# Patient Record
Sex: Male | Born: 1985 | Race: Black or African American | Hispanic: No | Marital: Single | Smoking: Current every day smoker
Health system: Southern US, Community
[De-identification: ages and names within clinical notes are randomized; demographics above are authoritative.]

## PROBLEM LIST (undated history)

## (undated) DIAGNOSIS — B977 Papillomavirus as the cause of diseases classified elsewhere: Secondary | ICD-10-CM

## (undated) DIAGNOSIS — I10 Essential (primary) hypertension: Secondary | ICD-10-CM

## (undated) DIAGNOSIS — E119 Type 2 diabetes mellitus without complications: Secondary | ICD-10-CM

## (undated) DIAGNOSIS — E785 Hyperlipidemia, unspecified: Secondary | ICD-10-CM

## (undated) HISTORY — PX: ABCESS DRAINAGE: SHX399

---
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
## 2009-05-05 ENCOUNTER — Emergency Department (HOSPITAL_COMMUNITY): Admission: EM | Admit: 2009-05-05 | Discharge: 2009-05-05 | Payer: Self-pay | Admitting: Family Medicine

## 2010-10-22 ENCOUNTER — Emergency Department (HOSPITAL_COMMUNITY)
Admission: EM | Admit: 2010-10-22 | Discharge: 2010-10-22 | Payer: Self-pay | Source: Home / Self Care | Admitting: Family Medicine

## 2010-10-23 ENCOUNTER — Inpatient Hospital Stay (HOSPITAL_COMMUNITY): Admission: EM | Admit: 2010-10-23 | Discharge: 2010-10-28 | Payer: Self-pay | Admitting: Emergency Medicine

## 2011-01-11 ENCOUNTER — Emergency Department (HOSPITAL_COMMUNITY)
Admission: EM | Admit: 2011-01-11 | Discharge: 2011-01-11 | Disposition: A | Discharge status: Home / Self Care | Payer: Self-pay | Attending: Emergency Medicine | Admitting: Emergency Medicine

## 2011-01-11 DIAGNOSIS — J029 Acute pharyngitis, unspecified: Secondary | ICD-10-CM | POA: Insufficient documentation

## 2011-01-11 DIAGNOSIS — R599 Enlarged lymph nodes, unspecified: Secondary | ICD-10-CM | POA: Insufficient documentation

## 2011-01-12 LAB — STREP A DNA PROBE: Group A Strep Probe: NEGATIVE

## 2011-02-21 LAB — BASIC METABOLIC PANEL
BUN: 3 mg/dL — ABNORMAL LOW (ref 6–23)
BUN: 6 mg/dL (ref 6–23)
CO2: 28 mEq/L (ref 19–32)
CO2: 29 mEq/L (ref 19–32)
CO2: 30 mEq/L (ref 19–32)
Calcium: 8.2 mg/dL — ABNORMAL LOW (ref 8.4–10.5)
Calcium: 8.2 mg/dL — ABNORMAL LOW (ref 8.4–10.5)
Calcium: 8.4 mg/dL (ref 8.4–10.5)
Calcium: 8.5 mg/dL (ref 8.4–10.5)
Calcium: 8.6 mg/dL (ref 8.4–10.5)
Chloride: 106 mEq/L (ref 96–112)
Chloride: 96 mEq/L (ref 96–112)
Creatinine, Ser: 0.97 mg/dL (ref 0.4–1.5)
Creatinine, Ser: 1.03 mg/dL (ref 0.4–1.5)
Creatinine, Ser: 1.1 mg/dL (ref 0.4–1.5)
Creatinine, Ser: 1.29 mg/dL (ref 0.4–1.5)
GFR calc Af Amer: >60 mL/min (ref >60)
GFR calc Af Amer: >60 mL/min (ref >60)
GFR calc Af Amer: >60 mL/min (ref >60)
GFR calc Af Amer: >60 mL/min (ref >60)
GFR calc non Af Amer: >60 mL/min (ref >60)
GFR calc non Af Amer: >60 mL/min (ref >60)
Glucose, Bld: 103 mg/dL — ABNORMAL HIGH (ref 70–99)
Glucose, Bld: 122 mg/dL — ABNORMAL HIGH (ref 70–99)
Glucose, Bld: 92 mg/dL (ref 70–99)
Potassium: 3.2 mEq/L — ABNORMAL LOW (ref 3.5–5.1)
Potassium: 3.5 mEq/L (ref 3.5–5.1)
Sodium: 132 mEq/L — ABNORMAL LOW (ref 135–145)
Sodium: 135 mEq/L (ref 135–145)
Sodium: 137 mEq/L (ref 135–145)
Sodium: 141 mEq/L (ref 135–145)

## 2011-02-21 LAB — CBC
HCT: 36.2 % — ABNORMAL LOW (ref 39.0–52.0)
HCT: 36.6 % — ABNORMAL LOW (ref 39.0–52.0)
HCT: 38.4 % — ABNORMAL LOW (ref 39.0–52.0)
HCT: 41.5 % (ref 39.0–52.0)
Hemoglobin: 11.7 g/dL — ABNORMAL LOW (ref 13.0–17.0)
Hemoglobin: 12.1 g/dL — ABNORMAL LOW (ref 13.0–17.0)
Hemoglobin: 12.2 g/dL — ABNORMAL LOW (ref 13.0–17.0)
Hemoglobin: 12.5 g/dL — ABNORMAL LOW (ref 13.0–17.0)
Hemoglobin: 14.3 g/dL (ref 13.0–17.0)
MCH: 28.9 pg (ref 26.0–34.0)
MCH: 29.1 pg (ref 26.0–34.0)
MCH: 29.2 pg (ref 26.0–34.0)
MCH: 30.4 pg (ref 26.0–34.0)
MCHC: 33.1 g/dL (ref 30.0–36.0)
MCHC: 34.1 g/dL (ref 30.0–36.0)
MCHC: 34.5 g/dL (ref 30.0–36.0)
MCV: 88.1 fL (ref 78.0–100.0)
MCV: 88.7 fL (ref 78.0–100.0)
Platelets: 157 10*3/uL (ref 150–400)
Platelets: 165 10*3/uL (ref 150–400)
Platelets: 187 10*3/uL (ref 150–400)
Platelets: 219 10*3/uL (ref 150–400)
RBC: 4.04 MIL/uL — ABNORMAL LOW (ref 4.22–5.81)
RBC: 4.05 MIL/uL — ABNORMAL LOW (ref 4.22–5.81)
RBC: 4.16 MIL/uL — ABNORMAL LOW (ref 4.22–5.81)
RBC: 4.71 MIL/uL (ref 4.22–5.81)
RDW: 13.4 % (ref 11.5–15.5)
RDW: 13.7 % (ref 11.5–15.5)
RDW: 13.9 % (ref 11.5–15.5)
RDW: 14 % (ref 11.5–15.5)
WBC: 17.5 10*3/uL — ABNORMAL HIGH (ref 4.0–10.5)
WBC: 22.2 10*3/uL — ABNORMAL HIGH (ref 4.0–10.5)
WBC: 25.3 10*3/uL — ABNORMAL HIGH (ref 4.0–10.5)
WBC: 26.2 10*3/uL — ABNORMAL HIGH (ref 4.0–10.5)
WBC: 8.3 10*3/uL (ref 4.0–10.5)

## 2011-02-21 LAB — COMPREHENSIVE METABOLIC PANEL
ALT: 16 U/L (ref 0–53)
Alkaline Phosphatase: 75 U/L (ref 39–117)
CO2: 27 mEq/L (ref 19–32)
GFR calc non Af Amer: >60 mL/min (ref >60)
Glucose, Bld: 107 mg/dL — ABNORMAL HIGH (ref 70–99)
Potassium: 3.8 mEq/L (ref 3.5–5.1)
Sodium: 137 mEq/L (ref 135–145)
Total Protein: 6.7 g/dL (ref 6.0–8.3)

## 2011-02-21 LAB — DIFFERENTIAL
Basophils Absolute: 0 10*3/uL (ref 0.0–0.1)
Basophils Absolute: 0 10*3/uL (ref 0.0–0.1)
Basophils Absolute: 0 10*3/uL (ref 0.0–0.1)
Basophils Relative: 0 % (ref 0–1)
Basophils Relative: 0 % (ref 0–1)
Basophils Relative: 0 % (ref 0–1)
Basophils Relative: 0 % (ref 0–1)
Eosinophils Absolute: 0 10*3/uL (ref 0.0–0.7)
Eosinophils Absolute: 0 10*3/uL (ref 0.0–0.7)
Eosinophils Absolute: 0 10*3/uL (ref 0.0–0.7)
Eosinophils Absolute: 0.2 10*3/uL (ref 0.0–0.7)
Eosinophils Relative: 0 % (ref 0–5)
Eosinophils Relative: 0 % (ref 0–5)
Eosinophils Relative: 2 % (ref 0–5)
Lymphocytes Relative: 10 % — ABNORMAL LOW (ref 12–46)
Lymphocytes Relative: 14 % (ref 12–46)
Lymphocytes Relative: 4 % — ABNORMAL LOW (ref 12–46)
Lymphocytes Relative: 7 % — ABNORMAL LOW (ref 12–46)
Lymphs Abs: 1.2 10*3/uL (ref 0.7–4.0)
Lymphs Abs: 2.2 10*3/uL (ref 0.7–4.0)
Monocytes Absolute: 2.4 10*3/uL — ABNORMAL HIGH (ref 0.1–1.0)
Monocytes Absolute: 3.3 10*3/uL — ABNORMAL HIGH (ref 0.1–1.0)
Monocytes Relative: 10 % (ref 3–12)
Monocytes Relative: 12 % (ref 3–12)
Monocytes Relative: 9 % (ref 3–12)
Neutro Abs: 22.2 10*3/uL — ABNORMAL HIGH (ref 1.7–7.7)
Neutrophils Relative %: 74 % (ref 43–77)
Neutrophils Relative %: 81 % — ABNORMAL HIGH (ref 43–77)
Neutrophils Relative %: 83 % — ABNORMAL HIGH (ref 43–77)
Neutrophils Relative %: 85 % — ABNORMAL HIGH (ref 43–77)
Neutrophils Relative %: 86 % — ABNORMAL HIGH (ref 43–77)

## 2011-02-21 LAB — ANAEROBIC CULTURE

## 2011-02-21 LAB — WOUND CULTURE: Gram Stain: NONE SEEN

## 2011-02-21 LAB — PATHOLOGIST SMEAR REVIEW

## 2011-02-21 LAB — CULTURE, BLOOD (ROUTINE X 2): Culture  Setup Time: 201111131406

## 2011-02-21 LAB — CULTURE, ROUTINE-ABSCESS

## 2011-02-21 LAB — VANCOMYCIN, TROUGH: Vancomycin Tr: 11.4 ug/mL (ref 10.0–20.0)

## 2012-04-06 ENCOUNTER — Encounter (HOSPITAL_COMMUNITY): Payer: Self-pay | Admitting: Emergency Medicine

## 2012-04-06 ENCOUNTER — Emergency Department (HOSPITAL_COMMUNITY): Payer: Self-pay

## 2012-04-06 ENCOUNTER — Emergency Department (HOSPITAL_COMMUNITY)
Admission: EM | Admit: 2012-04-06 | Discharge: 2012-04-06 | Disposition: A | Discharge status: Home / Self Care | Payer: Self-pay | Attending: Emergency Medicine | Admitting: Emergency Medicine

## 2012-04-06 DIAGNOSIS — W19XXXA Unspecified fall, initial encounter: Secondary | ICD-10-CM

## 2012-04-06 DIAGNOSIS — M79671 Pain in right foot: Secondary | ICD-10-CM

## 2012-04-06 DIAGNOSIS — M25579 Pain in unspecified ankle and joints of unspecified foot: Secondary | ICD-10-CM | POA: Insufficient documentation

## 2012-04-06 DIAGNOSIS — W108XXA Fall (on) (from) other stairs and steps, initial encounter: Secondary | ICD-10-CM | POA: Insufficient documentation

## 2012-04-06 MED ORDER — IBUPROFEN 800 MG PO TABS: 800.0000 mg | ORAL_TABLET | Freq: Three times a day (TID) | ORAL | Status: AC | PRN

## 2012-04-06 NOTE — ED Notes (Signed)
Ortho paged for crutches and aso

## 2012-04-06 NOTE — Discharge Instructions (Signed)
X-ray was normal. Ankle brace and crutches. Ice. Elevate. Pain medication.

## 2012-04-06 NOTE — ED Provider Notes (Signed)
History     CSN: [id number]  Arrival date & time 04/06/12  1233   First MD Initiated Contact with Patient 04/06/12 1300      Chief Complaint  Patient presents with  . Fall  . Foot Pain    right   . Ankle Pain    right    (Consider location/radiation/quality/duration/timing/severity/associated sxs/prior treatment) HPI... accidental fall down a couple steps last night. Complains of right foot pain. No head or neck trauma. Pain is minimal palpation makes it worse  History reviewed. No pertinent past medical history.  Past Surgical History  Procedure Date  . Abcess drainage     History reviewed. No pertinent family history.  History  Substance Use Topics  . Smoking status: Current Everyday Smoker  . Smokeless tobacco: Not on file  . Alcohol Use: No      Review of Systems  All other systems reviewed and are negative.    Allergies  Review of patient's allergies indicates no known allergies.  Home Medications   Current Outpatient Rx  Name Route Sig Dispense Refill  . IBUPROFEN 200 MG PO TABS Oral Take 800 mg by mouth every 6 (six) hours as needed. For pain.    . IBUPROFEN 800 MG PO TABS Oral Take 1 tablet (800 mg total) by mouth every 8 (eight) hours as needed for pain. 20 tablet 0    BP 126/68  Pulse 56  Temp(Src) 98.1 F (36.7 C) (Oral)  Resp 15  SpO2 97%  Physical Exam  Nursing note and vitals reviewed. Constitutional: He is oriented to person, place, and time. He appears well-developed and well-nourished.  HENT:  Head: Normocephalic and atraumatic.  Eyes: Conjunctivae and EOM are normal. Pupils are equal, round, and reactive to light.  Neck: Normal range of motion. Neck supple.  Musculoskeletal:       Right lower extremity:  Ankle nontender. Tenderness around the lateral aspect of foot.  Neurovascular intact  Neurological: He is alert and oriented to person, place, and time.  Skin: Skin is warm and dry.  Psychiatric: He has a normal mood and  affect.    ED Course  Procedures (including critical care time)  Labs Reviewed - No data to display Dg Foot Complete Right  04/06/2012  *RADIOLOGY REPORT*  Clinical Data: Fall with right foot pain.  RIGHT FOOT COMPLETE - 3+ VIEW  Comparison: None.  Findings: No evidence of acute fracture or dislocation.  There is a hallux valgus deformity.  No bony lesions or soft tissue abnormalities.  IMPRESSION: No acute fracture identified.  Hallux valgus deformity.  Original Report Authenticated By: Reola Calkins, M.D.     1. Fall   2. Right foot pain       MDM  X-ray of right foot negative. Ankle brace, crutches, ibuprofen        Donnetta Hutching, MD 04/06/12 1512

## 2012-04-06 NOTE — Progress Notes (Signed)
Orthopedic Tech Progress Note Patient Details:  Michael Blankenship 10-26-1986 [id number]  Other Ortho Devices Type of Ortho Device: Crutches;ASO Ortho Device Location: right foot Ortho Device Interventions: Application   [name] T 04/06/2012, 3:01 PM

## 2012-04-06 NOTE — ED Notes (Signed)
Pt reports fell last night down steps last night. Pt c/o right foot/ankle pain.

## 2012-12-16 ENCOUNTER — Emergency Department (HOSPITAL_COMMUNITY)
Admission: EM | Admit: 2012-12-16 | Discharge: 2012-12-16 | Disposition: A | Discharge status: Home / Self Care | Payer: Self-pay | Attending: Emergency Medicine | Admitting: Emergency Medicine

## 2012-12-16 ENCOUNTER — Encounter (HOSPITAL_COMMUNITY): Payer: Self-pay | Admitting: Cardiology

## 2012-12-16 DIAGNOSIS — F17200 Nicotine dependence, unspecified, uncomplicated: Secondary | ICD-10-CM | POA: Insufficient documentation

## 2012-12-16 DIAGNOSIS — J020 Streptococcal pharyngitis: Secondary | ICD-10-CM | POA: Insufficient documentation

## 2012-12-16 LAB — RAPID STREP SCREEN (MED CTR MEBANE ONLY): Streptococcus, Group A Screen (Direct): POSITIVE — AB

## 2012-12-16 MED ORDER — PENICILLIN V POTASSIUM 500 MG PO TABS: 500.0000 mg | ORAL_TABLET | Freq: Four times a day (QID) | ORAL | Status: AC

## 2012-12-16 MED ORDER — IBUPROFEN 600 MG PO TABS: 600.0000 mg | ORAL_TABLET | Freq: Four times a day (QID) | ORAL | Status: DC | PRN

## 2012-12-16 NOTE — ED Notes (Signed)
MD at bedside with RN. 

## 2012-12-16 NOTE — ED Notes (Signed)
Pt c/o sore throat since Thursday with mild fever. Reports taking OTC medications without any relief.

## 2012-12-16 NOTE — ED Notes (Signed)
MD informed pt treatment is either antibiotic pills or an antibiotic shot; pt states he does not want a shot but cannot afford the antibiotic pills;

## 2012-12-16 NOTE — ED Provider Notes (Signed)
History   This chart was scribed for Michael Kaplan, MD by Gerlean Ren, ED Scribe. This patient was seen in room TR08C/TR08C and the patient's care was started at 7:37 PM    CSN: [id number]  Arrival date & time 12/16/12  1641   First MD Initiated Contact with Patient 12/16/12 1931      Chief Complaint  Patient presents with  . Sore Throat     The history is provided by the patient. No language interpreter was used.   Cohl Behrens is a 27 y.o. male who presents to the Emergency Department complaining of 4-5 days of a sore throat with associated chills, fever, myalgias, and painful swallowing causing decreased appetite.  Pt has h/o tonsillar abscess.  Pt denies cough.  Pt is a current everyday smoker but denies alcohol use.   History reviewed. No pertinent past medical history.  Past Surgical History  Procedure Date  . Abcess drainage     History reviewed. No pertinent family history.  History  Substance Use Topics  . Smoking status: Current Every Day Smoker  . Smokeless tobacco: Not on file  . Alcohol Use: No      Review of Systems  Constitutional: Positive for fever and chills.  HENT: Positive for sore throat.   Respiratory: Negative for cough.   Musculoskeletal: Positive for myalgias.    Allergies  Review of patient's allergies indicates no known allergies.  Home Medications   Current Outpatient Rx  Name  Route  Sig  Dispense  Refill  . DIPHENHYDRAMINE HCL 25 MG PO CAPS   Oral   Take 25 mg by mouth every 6 (six) hours as needed. For swelling/pain         . IBUPROFEN 200 MG PO TABS   Oral   Take 800 mg by mouth every 6 (six) hours as needed. For pain           BP 144/81  Pulse 68  Temp 99 F (37.2 C) (Oral)  Resp 18  SpO2 99%  Physical Exam  Nursing note and vitals reviewed. Constitutional: He is oriented to person, place, and time. He appears well-developed and well-nourished. No distress.  HENT:  Head: Normocephalic and atraumatic.   Erythema of posterior pharynx, no exudates  Eyes: Conjunctivae normal are normal.  Neck: Neck supple. No tracheal deviation present.       Anterior cervical lymphadenopathy.  Cardiovascular: Normal rate, regular rhythm and normal heart sounds.   No murmur heard. Pulmonary/Chest: Effort normal and breath sounds normal. No respiratory distress. He has no wheezes.       Anterior auscultation of lungs clear.  Abdominal: Soft. Bowel sounds are normal.  Musculoskeletal: Normal range of motion.  Neurological: He is alert and oriented to person, place, and time.  Skin: Skin is warm and dry.  Psychiatric: He has a normal mood and affect. His behavior is normal.    ED Course  Procedures (including critical care time) DIAGNOSTIC STUDIES: Oxygen Saturation is 99% on room air, normal by my interpretation.    COORDINATION OF CARE: 7:42 PM- Patient informed of clinical course, understands medical decision-making process, and agrees with plan.  Pt refuses to have IM treatment in buttock.  Results for orders placed during the hospital encounter of 12/16/12  RAPID STREP SCREEN      Component Value Range   Streptococcus, Group A Screen (Direct) POSITIVE (*) NEGATIVE   No diagnosis found.    MDM  I personally performed the services described in this  documentation, which was scribed in my presence. The recorded information has been reviewed and is accurate.  Pt comes in with cc of sore throat. Has 3/4 Centor's criteria and + strep throat. Will give Pen V K. Pt is refusing IM Penicillin.        Michael Kaplan, MD 12/16/12 2017

## 2014-03-07 ENCOUNTER — Emergency Department (HOSPITAL_COMMUNITY)
Admission: EM | Admit: 2014-03-07 | Discharge: 2014-03-07 | Disposition: A | Discharge status: Home / Self Care | Payer: Self-pay | Attending: Emergency Medicine | Admitting: Emergency Medicine

## 2014-03-07 ENCOUNTER — Encounter (HOSPITAL_COMMUNITY): Payer: Self-pay | Admitting: Emergency Medicine

## 2014-03-07 DIAGNOSIS — R590 Localized enlarged lymph nodes: Secondary | ICD-10-CM

## 2014-03-07 DIAGNOSIS — F17200 Nicotine dependence, unspecified, uncomplicated: Secondary | ICD-10-CM | POA: Insufficient documentation

## 2014-03-07 DIAGNOSIS — J029 Acute pharyngitis, unspecified: Secondary | ICD-10-CM | POA: Insufficient documentation

## 2014-03-07 LAB — RAPID STREP SCREEN (MED CTR MEBANE ONLY): STREPTOCOCCUS, GROUP A SCREEN (DIRECT): NEGATIVE

## 2014-03-07 MED ORDER — CHLORHEXIDINE GLUCONATE 0.12 % MT SOLN: 15.0000 mL | Freq: Two times a day (BID) | OROMUCOSAL | Status: DC

## 2014-03-07 NOTE — ED Notes (Signed)
He states hes had a swollen knot under his chin for a year then noticed today pain and white spots to back of throat.

## 2014-03-07 NOTE — Discharge Instructions (Signed)
Pharyngitis Pharyngitis is redness, pain, and swelling (inflammation) of your pharynx.  CAUSES  Pharyngitis is usually caused by infection. Most of the time, these infections are from viruses (viral) and are part of a cold. However, sometimes pharyngitis is caused by bacteria (bacterial). Pharyngitis can also be caused by allergies. Viral pharyngitis may be spread from person to person by coughing, sneezing, and personal items or utensils (cups, forks, spoons, toothbrushes). Bacterial pharyngitis may be spread from person to person by more intimate contact, such as kissing.  SIGNS AND SYMPTOMS  Symptoms of pharyngitis include:   Sore throat.   Tiredness (fatigue).   Low-grade fever.   Headache.  Joint pain and muscle aches.  Skin rashes.  Swollen lymph nodes.  Plaque-like film on throat or tonsils (often seen with bacterial pharyngitis). DIAGNOSIS  Your health care provider will ask you questions about your illness and your symptoms. Your medical history, along with a physical exam, is often all that is needed to diagnose pharyngitis. Sometimes, a rapid strep test is done. Other lab tests may also be done, depending on the suspected cause.  TREATMENT  Viral pharyngitis will usually get better in 3 4 days without the use of medicine. Bacterial pharyngitis is treated with medicines that kill germs (antibiotics).  HOME CARE INSTRUCTIONS   Drink enough water and fluids to keep your urine clear or pale yellow.   Only take over-the-counter or prescription medicines as directed by your health care provider:   If you are prescribed antibiotics, make sure you finish them even if you start to feel better.   Do not take aspirin.   Get lots of rest.   Gargle with 8 oz of salt water ( tsp of salt per 1 qt of water) as often as every 1 2 hours to soothe your throat.   Throat lozenges (if you are not at risk for choking) or sprays may be used to soothe your throat. SEEK MEDICAL  CARE IF:   You have large, tender lumps in your neck.  You have a rash.  You cough up green, yellow-brown, or bloody spit. SEEK IMMEDIATE MEDICAL CARE IF:   Your neck becomes stiff.  You drool or are unable to swallow liquids.  You vomit or are unable to keep medicines or liquids down.  You have severe pain that does not go away with the use of recommended medicines.  You have trouble breathing (not caused by a stuffy nose). MAKE SURE YOU:   Understand these instructions.  Will watch your condition.  Will get help right away if you are not doing well or get worse. Document Released: 11/27/2005 Document Revised: 09/17/2013 Document Reviewed: 08/04/2013 [facility] Patient Information 2014 [place], [place].   Lymphadenopathy Lymphadenopathy means "disease of the lymph glands." But the term is usually used to describe swollen or enlarged lymph glands, also called lymph nodes. These are the bean-shaped organs found in many locations including the neck, underarm, and groin. Lymph glands are part of the immune system, which fights infections in your body. Lymphadenopathy can occur in just one area of the body, such as the neck, or it can be generalized, with lymph node enlargement in several areas. The nodes found in the neck are the most common sites of lymphadenopathy. CAUSES  When your immune system responds to germs (such as viruses or bacteria ), infection-fighting cells and fluid build up. This causes the glands to grow in size. This is usually not something to worry about. Sometimes, the glands themselves can  become infected and inflamed. This is called lymphadenitis. Enlarged lymph nodes can be caused by many diseases:  Bacterial disease, such as strep throat or a skin infection.  Viral disease, such as a common cold.  Other germs, such as lyme disease, tuberculosis, or sexually transmitted diseases.  Cancers, such as lymphoma (cancer of the lymphatic system) or leukemia  (cancer of the white blood cells).  Inflammatory diseases such as lupus or rheumatoid arthritis.  Reactions to medications. Many of the diseases above are rare, but important. This is why you should see your caregiver if you have lymphadenopathy. SYMPTOMS   Swollen, enlarged lumps in the neck, back of the head or other locations.  Tenderness.  Warmth or redness of the skin over the lymph nodes.  Fever. DIAGNOSIS  Enlarged lymph nodes are often near the source of infection. They can help healthcare providers diagnose your illness. For instance:   Swollen lymph nodes around the jaw might be caused by an infection in the mouth.  Enlarged glands in the neck often signal a throat infection.  Lymph nodes that are swollen in more than one area often indicate an illness caused by a virus. Your caregiver most likely will know what is causing your lymphadenopathy after listening to your history and examining you. Blood tests, x-rays or other tests may be needed. If the cause of the enlarged lymph node cannot be found, and it does not go away by itself, then a biopsy may be needed. Your caregiver will discuss this with you. TREATMENT  Treatment for your enlarged lymph nodes will depend on the cause. Many times the nodes will shrink to normal size by themselves, with no treatment. Antibiotics or other medicines may be needed for infection. Only take over-the-counter or prescription medicines for pain, discomfort or fever as directed by your caregiver. HOME CARE INSTRUCTIONS  Swollen lymph glands usually return to normal when the underlying medical condition goes away. If they persist, contact your health-care provider. He/she might prescribe antibiotics or other treatments, depending on the diagnosis. Take any medications exactly as prescribed. Keep any follow-up appointments made to check on the condition of your enlarged nodes.  SEEK MEDICAL CARE IF:   Swelling lasts for more than two  weeks.  You have symptoms such as weight loss, night sweats, fatigue or fever that does not go away.  The lymph nodes are hard, seem fixed to the skin or are growing rapidly.  Skin over the lymph nodes is red and inflamed. This could mean there is an infection. SEEK IMMEDIATE MEDICAL CARE IF:   Fluid starts leaking from the area of the enlarged lymph node.  You develop a fever of 102 F (38.9 C) or greater.  Severe pain develops (not necessarily at the site of a large lymph node).  You develop chest pain or shortness of breath.  You develop worsening abdominal pain. MAKE SURE YOU:   Understand these instructions.  Will watch your condition.  Will get help right away if you are not doing well or get worse. Document Released: 09/05/2008 Document Revised: 02/19/2012 Document Reviewed: 09/05/2008 [facility] Patient Information 2014 [place], [place].   Emergency Department Resource Guide 1) Find a Doctor and Pay Out of Pocket Although you won't have to find out who is covered by your insurance plan, it is a good idea to ask around and get recommendations. You will then need to call the office and see if the doctor you have chosen will accept you as a new patient and  what types of options they offer for patients who are self-pay. Some doctors offer discounts or will set up payment plans for their patients who do not have insurance, but you will need to ask so you aren't surprised when you get to your appointment.  2) Contact Your Local Health Department Not all health departments have doctors that can see patients for sick visits, but many do, so it is worth a call to see if yours does. If you don't know where your local health department is, you can check in your phone book. The CDC also has a tool to help you locate your state's health department, and many state websites also have listings of all of their local health departments.  3) Find a Walk-in Clinic If your illness is not likely  to be very severe or complicated, you may want to try a walk in clinic. These are popping up all over the country in pharmacies, drugstores, and shopping centers. They're usually staffed by nurse practitioners or physician assistants that have been trained to treat common illnesses and complaints. They're usually fairly quick and inexpensive. However, if you have serious medical issues or chronic medical problems, these are probably not your best option.  No Primary Care Doctor: - Call Health Connect at  [phone] - they can help you locate a primary care doctor that  accepts your insurance, provides certain services, etc. - Physician Referral Service- [phone]  Chronic Pain Problems: Organization         Address  Phone   Notes  [facility]  [phone] Patients need to be referred by their primary care doctor.   Medication Assistance: Organization         Address  Phone   Notes  [facility] Medication [facility] [address] [place] [place], [place] [address] [phone] --Must be a resident of [facility] -- Must have NO insurance coverage whatsoever (no Medicaid/ Medicare, etc.) -- The pt. MUST have a primary care doctor that directs their care regularly and follows them in the community   MedAssist  [phone]   [employer]  [phone]    Agencies that provide inexpensive medical care: Organization         Address  Phone   Notes  [facility]  [phone]   [facility]    [phone]   [facility] [address] [place], [place] [address] [phone]   [facility] [address] [place] [place] [phone]   Planned Parenthood    [phone]   [facility]    [phone]   [facility]  [address], [place] Phone:  [phone], Fax:  [phone] Hours of Operation:  9 am - 6 pm, M-F.   Also accepts Medicaid/Medicare and self-pay.  [facility]  [address], [place] Phone: [phone], Fax: [phone]. Hours of Operation:  8:30 am - 5:30 pm, M-F.  Also accepts Medicaid and self-pay.  [facility] [place] [address], [place] Phone: [phone]   [facility] [address] Natasha Bence [place], [place] [phone], Ext. [phone] Mondays & Thursdays: 7-9 AM.  First 15 patients are seen on a first come, first serve basis.    Medicaid-accepting [facility] Providers:  Retail buyer  Notes  [facility] [address], [place] [phone] Also accepts self-pay patients.  [facility] [address] Laurell Josephs [place], [place]  [phone]   [facility] [address], [place] [phone]   [facility] [address], [place] [phone]   Renaye Rakers [address], [place]   [phone] Only accepts [place] Access [place] patients after they have their name applied to their card.   Self-Pay (no insurance) in [facility]:  Organization         Address  Phone   Notes  Sickle Cell Patients, [facility] [address] [place], [place] [phone]   [facility] [address] [place], [place] [phone]   [facility] [place]  [address], [place] [phone]   [facility]/Dr. Osei-Bonsu  [address], [place] or [address], [place] [phone] Phone number for both [place] and [place] locations is the same.  [facility] [address] [place] [phone]   [facility] [address], [place] or [address] [phone]   [facility] [address]  [place] [phone], phone; [phone], fax Sees patients 1st and 3rd Saturday of every month.  Must not qualify for public or private insurance (i.e. Medicaid, Medicare, [place] Health Choice, Veterans' Benefits)  Household income should be no more than 200% of the poverty level The clinic cannot treat you if you are pregnant or think you are pregnant  Sexually transmitted diseases are not treated at the clinic.    Dental Care: Organization         Address  Phone  Notes  [facility] Department of [facility] [address] [place], [place] [phone] Accepts children up to age 20 who are enrolled in [place] or [place] Health Choice; pregnant women with a Medicaid card; and children who have applied for Medicaid or [place] Health Choice, but were declined, whose parents can pay a reduced fee at time of service.  [facility]  [address], [place] [phone] Accepts children up to age 9 who are enrolled in [place] or [place] Health Choice; pregnant women with a Medicaid card; and children who have applied for Medicaid or [place] Health Choice, but were declined, whose parents can pay a reduced fee at time of service.  [facility]  [address] [place], [place] [phone] Patients are seen by appointment only. Walk-ins are not accepted. [facility] will see patients 53 years of age and older. Monday - Tuesday (8am-5pm) Most Wednesdays (8:30-5pm) $30 per visit, cash only  [facility]  [address], [facility] [phone] Patients are seen by appointment only. Walk-ins are not accepted. [facility] will see patients 53 years of age and older. One Wednesday Evening (Monthly: Volunteer Based).  $30 per visit, cash only  [employer]  [phone] for adults; Children under age 25, call Graduate Pediatric Dentistry at [phone].  Children aged 43-14, please call [phone] to request a pediatric application.  Dental services are provided in all areas of dental care including fillings, crowns and bridges, complete  and partial dentures, implants, gum treatment, root canals, and extractions. Preventive care is also provided. Treatment is provided to both adults and children. Patients are selected via a lottery and there is often a waiting list.   [facility] [address] [place]  [phone] [email or web address]   [facility] [address] [place] [address] [place], [place] [phone], Ext. [phone] Second and Fourth Thursday of each month, opens at 6:30 AM; Clinic ends at 9 AM.  Patients are seen on a first-come first-served basis, and a limited number are seen during each clinic.   [facility]  [address] Ether Griffins [place], [place] [phone]   Eligibility Requirements You must have lived in [place], [place], or [place] counties for at least the last three months.   You cannot be eligible for state or federal sponsored [place], including [employer], [place], or [employer].   You generally cannot be eligible for healthcare insurance through your employer.    How to apply: Eligibility screenings are held every Tuesday and Wednesday afternoon from 1:00 pm until 4:00 pm. You do not need an appointment for the interview!  [facility] [address] [place], [place] [phone]   [facility] Health Department  [phone]   [facility] Health Department  [phone]   [facility] Health Department  [phone]    Behavioral Health Resources in the Community: Intensive Outpatient Programs Organization         Address  Phone  Notes  [facility] [address], [place], [place] [phone]   [facility] Outpatient [address] [place], [place] [phone]   [facility] [address] [place], [place]  [phone]   [facility] [address]  [place], [place] [phone] or [phone]   Substance Abuse Resources Organization         Address  Phone  Notes  [facility]  [phone]   [facility]  [phone]   [facility] [place]  [phone]   Floydene Flock  [phone]   Residential & Outpatient Substance Abuse Program  [phone]   Psychological Services Organization         Address  Phone  Notes  [facility] Behavioral Health  [phone]   [facility] Services  [phone]   [facility] [address] [place] [phone] or [phone]    Mobile Crisis Teams Organization         Address  Phone  Notes  [facility], Mobile Crisis Care Unit  [phone]   [facility]  [address] [place], [place] [phone]   Doristine Locks [address] [place] [phone]    Self-Help/Support Groups Organization         Address  Phone             Notes  [facility] [place] - variety of support groups  336- I7437963 Call for more information  Narcotics Anonymous (NA), [facility] [address], [employer] [place]  2 meetings at this location   Statistician         Address  Phone  Notes  [facility] [address],    [place]  [phone]   [facility]  [address], [place] [address], [place], [place] [phone]   [facility] [address] [place], [place] [phone] Admissions: 8am-3pm M-F  [facility] [address]    [place], [place] [phone]   [facility] [address], [place], [place] [phone]   [facility] [address]  [place], [place] [phone]   [facility] - Intensive Outpatient [address], [place], [place] [phone]     [facility] [address]  [place], [place] [phone] or [phone]   [facility] ([facility]) [address] [place], [place] [phone] Accepts Medicaid  [facility] [address]  [place] [phone] Substance Abuse/Addiction Treatment   [facility] Resources Organization         Address  Phone  Notes  [facility]  [phone](888) [phone]   Angie FavaJulie Brannon, PhD [address] [place], [place]   [phone] or [phone]) [phone]   [facility]   [address] [place], [place] [phone]   [facility] [address], [place], [place] [phone]) [phone] Insurance/Medicaid/sponsorship through [facility]  [facility] [address]                                    [place], [place] [phone] Therapy/tele-psych/case  [facility] [address]   [place], [place] [phone]    Dr. Lolly MustacheArfeen  [phone]   [facility]  [employer] [facility] 1) [address],  2) [address], [place] 3)  [address], [place] [phone]  [phone](336) [phone]   [facility] Child Abuse Hotline [phone](336) [phone] or [phone](336) [phone] (After Hours)

## 2014-03-07 NOTE — ED Provider Notes (Signed)
  Medical screening examination/treatment/procedure(s) were performed by non-physician practitioner and as supervising physician I was immediately available for consultation/collaboration.   EKG Interpretation None         Gerhard Munchobert Delray Reza, MD 03/07/14 1336

## 2014-03-07 NOTE — ED Notes (Addendum)
Pt reports knot in neck for over a year; hx peritonsillar abscesses; no white patches seen; afebrile. Denies voice changes

## 2014-03-07 NOTE — ED Notes (Signed)
PT ambulated with baseline gait; VSS; A&Ox3; no signs of distress; respirations even and unlabored; skin warm and dry; no questions upon discharge.  

## 2014-03-07 NOTE — ED Notes (Signed)
PA at bedside.

## 2014-03-07 NOTE — ED Provider Notes (Signed)
CSN: [id number]     Arrival date & time 03/07/14  1103 History   None  This chart was scribed for Fayrene Helper PA-C, a non-physician practitioner working with Gerhard Munch, MD by Lewanda Rife, ED Scribe. This patient was seen in room TR05C/TR05C and the patient's care was started at 12:28 PM        Chief Complaint  Patient presents with  . Sore Throat     (Consider location/radiation/quality/duration/timing/severity/associated sxs/prior Treatment) The history is provided by the patient. No language interpreter was used.  HPI Comments: Michael Blankenship is a 28 y.o. male who presents to the Emergency Department complaining of constant worsening sore throat onset 4 days. Reports associated "white spots" in back of throat, and non-tender submandibular "knot" (1 year and unchanged in size). States pain is exacerbated by swallowing. Denies trying any alleviating factors. Denies associated fever, dysphagia, abdominal pain, and voice change. Reports PMHx of peritonsillar abscess. Denies other significant PMHx. State he smokes cigarettes.    History reviewed. No pertinent past medical history. Past Surgical History  Procedure Laterality Date  . Abcess drainage     History reviewed. No pertinent family history. History  Substance Use Topics  . Smoking status: Current Every Day Smoker  . Smokeless tobacco: Not on file  . Alcohol Use: No    Review of Systems  Constitutional: Negative for fever.  HENT: Positive for sore throat.   Psychiatric/Behavioral: Negative for confusion.      Allergies  Review of patient's allergies indicates no known allergies.  Home Medications   Current Outpatient Rx  Name  Route  Sig  Dispense  Refill  . diphenhydrAMINE (BENADRYL) 25 mg capsule   Oral   Take 25 mg by mouth every 6 (six) hours as needed. For swelling/pain         . ibuprofen (ADVIL,MOTRIN) 200 MG tablet   Oral   Take 800 mg by mouth every 6 (six) hours as needed. For pain          . ibuprofen (ADVIL,MOTRIN) 600 MG tablet   Oral   Take 1 tablet (600 mg total) by mouth every 6 (six) hours as needed for pain.   30 tablet   0    BP 131/71  Pulse 61  Temp(Src) 98.1 F (36.7 C) (Oral)  Resp 16  Ht 6\' 1"  (1.854 m)  Wt 252 lb 1.6 oz (114.352 kg)  BMI 33.27 kg/m2  SpO2 99% Physical Exam  Nursing note and vitals reviewed. Constitutional: He is oriented to person, place, and time. He appears well-developed and well-nourished. No distress.  HENT:  Head: Normocephalic and atraumatic.  Mouth/Throat: Uvula is midline and mucous membranes are normal. No uvula swelling. Posterior oropharyngeal erythema present. No oropharyngeal exudate, posterior oropharyngeal edema or tonsillar abscesses.  No signs of peritonsillar abscess  Eyes: EOM are normal.  Neck: Neck supple. No tracheal deviation present.  Cardiovascular: Normal rate.   Pulmonary/Chest: Effort normal. No respiratory distress.  Abdominal: Soft. There is no hepatosplenomegaly. There is no tenderness.  Musculoskeletal: Normal range of motion.  Neurological: He is alert and oriented to person, place, and time.  Skin: Skin is warm and dry.  Psychiatric: He has a normal mood and affect. His behavior is normal.    ED Course  Procedures  COORDINATION OF CARE:  Nursing notes reviewed. Vital signs reviewed. Initial pt interview and examination performed.   12:30 PM-Discussed work up plan with pt at bedside, which includes rapid strep screen. Pt agrees with plan.  12:32 PM Nursing Notes Reviewed/ Care Coordinated Interpretation of Laboratory Data incorporated into ED treatment Discussed results and treatment plan with pt. Pt demonstrates understanding and agrees with plan.  Treatment plan initiated:Medications - No data to display   12:47 PM No evidence of deep tissue infection. Strep negative, vital sign stable.  Pt concern of lymphadenopathy, will give resources.  No significant finding to suggest cancer  or overt infection.  Will need outpt follow up for further care.   Initial diagnostic testing ordered.     Labs Review Labs Reviewed  RAPID STREP SCREEN  CULTURE, GROUP A STREP   Imaging Review No results found.   EKG Interpretation None      MDM   Final diagnoses:  Pharyngitis  Lymphadenopathy of right cervical region    BP 107/65  Pulse 60  Temp(Src) 98.1 F (36.7 C) (Oral)  Resp 18  Ht 6\' 1"  (1.854 m)  Wt 252 lb 1.6 oz (114.352 kg)  BMI 33.27 kg/m2  SpO2 100%   I personally performed the services described in this documentation, which was scribed in my presence. The recorded information has been reviewed and is accurate.     Fayrene HelperBowie Kaiyana Bedore, PA-C 03/07/14 1251

## 2014-03-09 LAB — CULTURE, GROUP A STREP

## 2015-02-11 ENCOUNTER — Emergency Department (HOSPITAL_COMMUNITY): Payer: Self-pay

## 2015-02-11 ENCOUNTER — Encounter (HOSPITAL_COMMUNITY): Payer: Self-pay | Admitting: Emergency Medicine

## 2015-02-11 ENCOUNTER — Emergency Department (HOSPITAL_COMMUNITY)
Admission: EM | Admit: 2015-02-11 | Discharge: 2015-02-11 | Disposition: A | Discharge status: Home / Self Care | Payer: Self-pay | Attending: Emergency Medicine | Admitting: Emergency Medicine

## 2015-02-11 DIAGNOSIS — K2960 Other gastritis without bleeding: Secondary | ICD-10-CM

## 2015-02-11 DIAGNOSIS — Z720 Tobacco use: Secondary | ICD-10-CM | POA: Insufficient documentation

## 2015-02-11 DIAGNOSIS — E119 Type 2 diabetes mellitus without complications: Secondary | ICD-10-CM | POA: Insufficient documentation

## 2015-02-11 DIAGNOSIS — K521 Toxic gastroenteritis and colitis: Secondary | ICD-10-CM | POA: Insufficient documentation

## 2015-02-11 DIAGNOSIS — T65894A Toxic effect of other specified substances, undetermined, initial encounter: Secondary | ICD-10-CM | POA: Insufficient documentation

## 2015-02-11 DIAGNOSIS — I10 Essential (primary) hypertension: Secondary | ICD-10-CM | POA: Insufficient documentation

## 2015-02-11 DIAGNOSIS — T39315A Adverse effect of propionic acid derivatives, initial encounter: Secondary | ICD-10-CM | POA: Insufficient documentation

## 2015-02-11 DIAGNOSIS — R0602 Shortness of breath: Secondary | ICD-10-CM | POA: Insufficient documentation

## 2015-02-11 DIAGNOSIS — T39395A Adverse effect of other nonsteroidal anti-inflammatory drugs [NSAID], initial encounter: Secondary | ICD-10-CM

## 2015-02-11 DIAGNOSIS — R1010 Upper abdominal pain, unspecified: Secondary | ICD-10-CM

## 2015-02-11 HISTORY — DX: Hyperlipidemia, unspecified: E78.5

## 2015-02-11 HISTORY — DX: Type 2 diabetes mellitus without complications: E11.9

## 2015-02-11 HISTORY — DX: Essential (primary) hypertension: I10

## 2015-02-11 LAB — BASIC METABOLIC PANEL
ANION GAP: 6 (ref 5–15)
BUN: 9 mg/dL (ref 6–23)
CO2: 29 mmol/L (ref 19–32)
CREATININE: 1.05 mg/dL (ref 0.50–1.35)
Calcium: 9.5 mg/dL (ref 8.4–10.5)
Chloride: 101 mmol/L (ref 96–112)
GFR calc Af Amer: >90 mL/min (ref >90)
GFR calc non Af Amer: >90 mL/min (ref >90)
Glucose, Bld: 126 mg/dL — ABNORMAL HIGH (ref 70–99)
Potassium: 3.8 mmol/L (ref 3.5–5.1)
Sodium: 136 mmol/L (ref 135–145)

## 2015-02-11 LAB — HEPATIC FUNCTION PANEL
ALT: 35 U/L (ref 0–53)
AST: 28 U/L (ref 0–37)
Albumin: 3.7 g/dL (ref 3.5–5.2)
Alkaline Phosphatase: 71 U/L (ref 39–117)
BILIRUBIN TOTAL: 0.5 mg/dL (ref 0.3–1.2)
Total Protein: 7.3 g/dL (ref 6.0–8.3)

## 2015-02-11 LAB — CBC
HEMATOCRIT: 45.4 % (ref 39.0–52.0)
Hemoglobin: 15.6 g/dL (ref 13.0–17.0)
MCH: 30.1 pg (ref 26.0–34.0)
MCHC: 34.4 g/dL (ref 30.0–36.0)
MCV: 87.6 fL (ref 78.0–100.0)
PLATELETS: 190 10*3/uL (ref 150–400)
RBC: 5.18 MIL/uL (ref 4.22–5.81)
RDW: 13.5 % (ref 11.5–15.5)
WBC: 8.5 10*3/uL (ref 4.0–10.5)

## 2015-02-11 LAB — LIPASE, BLOOD: LIPASE: 31 U/L (ref 11–59)

## 2015-02-11 LAB — I-STAT TROPONIN, ED: TROPONIN I, POC: 0 ng/mL (ref 0.00–0.08)

## 2015-02-11 MED ORDER — SODIUM CHLORIDE 0.9 % IV SOLN
1000.0000 mL | Freq: Once | INTRAVENOUS | Status: AC
  Administered 2015-02-11: 1000 mL via INTRAVENOUS

## 2015-02-11 MED ORDER — GI COCKTAIL ~~LOC~~
30.0000 mL | Freq: Once | ORAL | Status: AC
  Administered 2015-02-11: 30 mL via ORAL
  Filled 2015-02-11: qty 30

## 2015-02-11 MED ORDER — ONDANSETRON 4 MG PO TBDP: ORAL_TABLET | ORAL | Status: DC

## 2015-02-11 MED ORDER — HYDROCODONE-ACETAMINOPHEN 5-325 MG PO TABS: 1.0000 | ORAL_TABLET | ORAL | Status: DC | PRN

## 2015-02-11 MED ORDER — PANTOPRAZOLE SODIUM 40 MG IV SOLR
40.0000 mg | Freq: Once | INTRAVENOUS | Status: AC
  Administered 2015-02-11: 40 mg via INTRAVENOUS
  Filled 2015-02-11: qty 40

## 2015-02-11 MED ORDER — ONDANSETRON HCL 4 MG/2ML IJ SOLN
4.0000 mg | Freq: Once | INTRAMUSCULAR | Status: AC
  Administered 2015-02-11: 4 mg via INTRAVENOUS
  Filled 2015-02-11: qty 2

## 2015-02-11 MED ORDER — HYDROMORPHONE HCL 1 MG/ML IJ SOLN
1.0000 mg | Freq: Once | INTRAMUSCULAR | Status: AC
Start: 2015-02-11 — End: 2015-02-11
  Administered 2015-02-11: 1 mg via INTRAVENOUS
  Filled 2015-02-11: qty 1

## 2015-02-11 MED ORDER — MORPHINE SULFATE 4 MG/ML IJ SOLN
4.0000 mg | Freq: Once | INTRAMUSCULAR | Status: AC
  Administered 2015-02-11: 4 mg via INTRAVENOUS
  Filled 2015-02-11: qty 1

## 2015-02-11 MED ORDER — LANSOPRAZOLE 30 MG PO CPDR: 30.0000 mg | DELAYED_RELEASE_CAPSULE | Freq: Every day | ORAL | Status: DC

## 2015-02-11 MED ORDER — SODIUM CHLORIDE 0.9 % IV BOLUS (SEPSIS)
1000.0000 mL | Freq: Once | INTRAVENOUS | Status: AC
  Administered 2015-02-11: 1000 mL via INTRAVENOUS

## 2015-02-11 NOTE — ED Notes (Signed)
Per EMS, pt started having left sided epigastric pain last night around 1700. Pt states the pain to be a 10/10 stabbing feeling. Pt states that he has taken tums and ibuprofen with no relief. Pt received 324 ASA en route, but refused NTG from the medics. Pt reports mild SOB.

## 2015-02-11 NOTE — Discharge Instructions (Signed)
Stop taking ibuprofen and all related medications. Follow-up with the gastroenterologist for persistent symptoms. Return immediately to the emergency department for blood in vomit or stool, worsening pain or any concerns.   Gastritis, Adult Gastritis is soreness and swelling (inflammation) of the lining of the stomach. Gastritis can develop as a sudden onset (acute) or long-term (chronic) condition. If gastritis is not treated, it can lead to stomach bleeding and ulcers. CAUSES  Gastritis occurs when the stomach lining is weak or damaged. Digestive juices from the stomach then inflame the weakened stomach lining. The stomach lining may be weak or damaged due to viral or bacterial infections. One common bacterial infection is the Helicobacter pylori infection. Gastritis can also result from excessive alcohol consumption, taking certain medicines, or having too much acid in the stomach.  SYMPTOMS  In some cases, there are no symptoms. When symptoms are present, they may include:  Pain or a burning sensation in the upper abdomen.  Nausea.  Vomiting.  An uncomfortable feeling of fullness after eating. DIAGNOSIS  Your caregiver may suspect you have gastritis based on your symptoms and a physical exam. To determine the cause of your gastritis, your caregiver may perform the following:  Blood or stool tests to check for the H pylori bacterium.  Gastroscopy. A thin, flexible tube (endoscope) is passed down the esophagus and into the stomach. The endoscope has a light and camera on the end. Your caregiver uses the endoscope to view the inside of the stomach.  Taking a tissue sample (biopsy) from the stomach to examine under a microscope. TREATMENT  Depending on the cause of your gastritis, medicines may be prescribed. If you have a bacterial infection, such as an H pylori infection, antibiotics may be given. If your gastritis is caused by too much acid in the stomach, H2 blockers or antacids may be  given. Your caregiver may recommend that you stop taking aspirin, ibuprofen, or other nonsteroidal anti-inflammatory drugs (NSAIDs). HOME CARE INSTRUCTIONS  Only take over-the-counter or prescription medicines as directed by your caregiver.  If you were given antibiotic medicines, take them as directed. Finish them even if you start to feel better.  Drink enough fluids to keep your urine clear or pale yellow.  Avoid foods and drinks that make your symptoms worse, such as:  Caffeine or alcoholic drinks.  Chocolate.  Peppermint or mint flavorings.  Garlic and onions.  Spicy foods.  Citrus fruits, such as oranges, lemons, or limes.  Tomato-based foods such as sauce, chili, salsa, and pizza.  Fried and fatty foods.  Eat small, frequent meals instead of large meals. SEEK IMMEDIATE MEDICAL CARE IF:   You have black or dark red stools.  You vomit blood or material that looks like coffee grounds.  You are unable to keep fluids down.  Your abdominal pain gets worse.  You have a fever.  You do not feel better after 1 week.  You have any other questions or concerns. MAKE SURE YOU:  Understand these instructions.  Will watch your condition.  Will get help right away if you are not doing well or get worse. Document Released: 11/21/2001 Document Revised: 05/28/2012 Document Reviewed: 01/10/2012 [facility] Patient Information 2015 [place], [place]. This information is not intended to replace advice given to you by your health care provider. Make sure you discuss any questions you have with your health care provider.

## 2015-02-11 NOTE — ED Provider Notes (Signed)
CSN: [id number]     Arrival date & time 02/11/15  0413 History   First MD Initiated Contact with Patient 02/11/15 [phone]     Chief Complaint  Patient presents with  . Chest Pain     (Consider location/radiation/quality/duration/timing/severity/associated sxs/prior Treatment) HPI History presents with one day of epigastric and left upper quadrant pain. The pain is sharp. He's had nausea and 2 episodes of vomiting. No diarrhea or constipation. Patient admits to regularly taking ibuprofen 800 mg for the areas aches and pains. Denies any lower extremity swelling. Denies coffee-ground emesis or melena. No frank blood in either the vomit or the stool.  Past Medical History  Diagnosis Date  . Hypertension   . Diabetes mellitus without complication   . Hyperlipidemia    Past Surgical History  Procedure Laterality Date  . Abcess drainage     History reviewed. No pertinent family history. History  Substance Use Topics  . Smoking status: Current Every Day Smoker  . Smokeless tobacco: Not on file  . Alcohol Use: No    Review of Systems  Constitutional: Negative for fever and chills.  Respiratory: Positive for shortness of breath. Negative for cough.   Cardiovascular: Negative for chest pain, palpitations and leg swelling.  Gastrointestinal: Positive for nausea, vomiting and abdominal pain. Negative for diarrhea, constipation and blood in stool.  Musculoskeletal: Negative for myalgias, back pain, neck pain and neck stiffness.  Skin: Negative for rash and wound.  Neurological: Negative for dizziness, weakness, light-headedness, numbness and headaches.  All other systems reviewed and are negative.     Allergies  Review of patient's allergies indicates no known allergies.  Home Medications   Prior to Admission medications   Medication Sig Start Date End Date Taking? Authorizing Provider  chlorhexidine (PERIDEX) 0.12 % solution Use as directed 15 mLs in the mouth or throat 2 (two) times  daily. Patient not taking: Reported on 02/11/2015 03/07/14   Fayrene HelperBowie Tran, PA-C  HYDROcodone-acetaminophen (NORCO) 5-325 MG per tablet Take 1-2 tablets by mouth every 4 (four) hours as needed for severe pain. 02/11/15   Loren Raceravid Scarlet Abad, MD  lansoprazole (PREVACID) 30 MG capsule Take 1 capsule (30 mg total) by mouth daily at 12 noon. 02/11/15   Loren Raceravid Lawyer Washabaugh, MD  ondansetron (ZOFRAN ODT) 4 MG disintegrating tablet 4mg  ODT q4 hours prn nausea/vomit 02/11/15   Loren Raceravid Ansar Skoda, MD   BP 143/82 mmHg  Pulse 63  Temp(Src) 98 F (36.7 C) (Oral)  Resp 9  Ht 6\' 1"  (1.854 m)  Wt 250 lb (113.399 kg)  BMI 32.99 kg/m2  SpO2 99% Physical Exam  Constitutional: He is oriented to person, place, and time. He appears well-developed and well-nourished. No distress.  HENT:  Head: Normocephalic and atraumatic.  Mouth/Throat: Oropharynx is clear and moist.  Eyes: EOM are normal. Pupils are equal, round, and reactive to light.  Neck: Normal range of motion. Neck supple.  Cardiovascular: Normal rate and regular rhythm.   Pulmonary/Chest: Effort normal and breath sounds normal. No respiratory distress. He has no wheezes. He has no rales. He exhibits no tenderness.  Abdominal: Soft. Bowel sounds are normal. He exhibits no distension and no mass. There is tenderness (tenderness to palpation in the epigastric and left upper quadrant. There is no rebound or guarding.). There is no rebound and no guarding.  Musculoskeletal: Normal range of motion. He exhibits no edema or tenderness.  No calf swelling or tenderness.  Neurological: He is alert and oriented to person, place, and time.  Moves all  extremities without deficit. Sensation is grossly intact.  Skin: Skin is warm and dry. No rash noted. No erythema.  Psychiatric: He has a normal mood and affect. His behavior is normal.  Nursing note and vitals reviewed.   ED Course  Procedures (including critical care time) Labs Review Labs Reviewed  BASIC METABOLIC PANEL -  Abnormal; Notable for the following:    Glucose, Bld 126 (*)    All other components within normal limits  CBC  LIPASE, BLOOD  HEPATIC FUNCTION PANEL  I-STAT TROPOININ, ED    Imaging Review Dg Abd Acute W/chest  02/11/2015   CLINICAL DATA:  Upper abdominal pain for 2 days. Nausea and vomiting last night.  EXAM: ACUTE ABDOMEN SERIES (ABDOMEN 2 VIEW & CHEST 1 VIEW)  COMPARISON:  None.  FINDINGS: There is no evidence of dilated bowel loops or free intraperitoneal air. No radiopaque calculi or other significant radiographic abnormality is seen. Heart size and mediastinal contours are within normal limits. Both lungs are clear.  IMPRESSION: Negative abdominal radiographs.  No acute cardiopulmonary disease.   Electronically Signed   By: Andreas Newport M.D.   On: 02/11/2015 07:10     EKG Interpretation   Date/Time:  Thursday February 11 2015 04:26:07 EST Ventricular Rate:  67 PR Interval:  178 QRS Duration: 107 QT Interval:  394 QTC Calculation: 416 R Axis:   104 Text Interpretation:  Sinus rhythm ST elev, probable normal early repol  pattern Confirmed by Ranae Palms  MD, Jazmen Lindenbaum ([id number]) on 02/11/2015 6:17:26 AM     EKG with Route early repol pattern. No previous EKG for comparison. MDM   Final diagnoses:  Upper abdominal pain  NSAID induced gastritis   For further vomiting in the emergency department. Normal laboratory workup. Patient is educated about not using ibuprofen and other NSAIDs. He'll be giving dietary friction. Also start on PPI. Patient also is given GI follow-up. He understands return precautions and voiced his understanding.     Loren Racer, MD 02/11/15 [phone]

## 2015-03-21 ENCOUNTER — Emergency Department (HOSPITAL_COMMUNITY)
Admission: EM | Admit: 2015-03-21 | Discharge: 2015-03-21 | Disposition: A | Discharge status: Home / Self Care | Payer: Self-pay | Attending: Emergency Medicine | Admitting: Emergency Medicine

## 2015-03-21 ENCOUNTER — Encounter (HOSPITAL_COMMUNITY): Payer: Self-pay | Admitting: Emergency Medicine

## 2015-03-21 DIAGNOSIS — Z720 Tobacco use: Secondary | ICD-10-CM | POA: Insufficient documentation

## 2015-03-21 DIAGNOSIS — Z79899 Other long term (current) drug therapy: Secondary | ICD-10-CM | POA: Insufficient documentation

## 2015-03-21 DIAGNOSIS — N342 Other urethritis: Secondary | ICD-10-CM | POA: Insufficient documentation

## 2015-03-21 DIAGNOSIS — E119 Type 2 diabetes mellitus without complications: Secondary | ICD-10-CM | POA: Insufficient documentation

## 2015-03-21 DIAGNOSIS — I10 Essential (primary) hypertension: Secondary | ICD-10-CM | POA: Insufficient documentation

## 2015-03-21 LAB — URINALYSIS, ROUTINE W REFLEX MICROSCOPIC
BILIRUBIN URINE: NEGATIVE
GLUCOSE, UA: NEGATIVE mg/dL
HGB URINE DIPSTICK: NEGATIVE
KETONES UR: NEGATIVE mg/dL
Nitrite: NEGATIVE
PH: 6 (ref 5.0–8.0)
PROTEIN: NEGATIVE mg/dL
SPECIFIC GRAVITY, URINE: 1.03 (ref 1.005–1.030)
Urobilinogen, UA: 1 mg/dL (ref 0.0–1.0)

## 2015-03-21 LAB — URINE MICROSCOPIC-ADD ON

## 2015-03-21 MED ORDER — AZITHROMYCIN 250 MG PO TABS
1000.0000 mg | ORAL_TABLET | Freq: Once | ORAL | Status: AC
  Administered 2015-03-21: 1000 mg via ORAL
  Filled 2015-03-21: qty 4

## 2015-03-21 MED ORDER — IBUPROFEN 400 MG PO TABS
400.0000 mg | ORAL_TABLET | Freq: Once | ORAL | Status: AC
  Administered 2015-03-21: 400 mg via ORAL
  Filled 2015-03-21: qty 1

## 2015-03-21 MED ORDER — CEFTRIAXONE SODIUM 250 MG IJ SOLR
250.0000 mg | Freq: Once | INTRAMUSCULAR | Status: AC
  Administered 2015-03-21: 250 mg via INTRAMUSCULAR
  Filled 2015-03-21: qty 250

## 2015-03-21 MED ORDER — LIDOCAINE HCL (PF) 1 % IJ SOLN
INTRAMUSCULAR | Status: AC
  Administered 2015-03-21: 5 mL
  Filled 2015-03-21: qty 5

## 2015-03-21 NOTE — ED Notes (Signed)
Pt reports penile discharge and painful urination since last Monday. Pt admits to unprotected sex over Easter break.

## 2015-03-21 NOTE — ED Notes (Signed)
Pt ambulated to the restroom independently, pt tolerated well.

## 2015-03-21 NOTE — Discharge Instructions (Signed)
It was our pleasure to provide your ER care today - we hope that you feel better.  You were given antibiotics to treat your symptoms.  A culture was sent the results of which will be back in 1-2 days - call to get the results then.  No sexual contact until after symptoms have completely resolved, and then use protection/condoms.   Have any sexual contacts checked by their doctor or health department.  Follow up with primary care doctor in coming week.  Return to ER if worse, new symptoms, fevers, other concern.      Urethritis Urethritis is an inflammation of the tube through which urine exits your bladder (urethra).  CAUSES Urethritis is often caused by an infection in your urethra. The infection can be viral, like herpes. The infection can also be bacterial, like gonorrhea. RISK FACTORS Risk factors of urethritis include:  Having sex without using a condom.  Having multiple sexual partners.  Having poor hygiene. SIGNS AND SYMPTOMS Symptoms of urethritis are less noticeable in women than in men. These symptoms include:  Burning feeling when you urinate (dysuria).  Discharge from your urethra.  Blood in your urine (hematuria).  Urinating more than usual. DIAGNOSIS  To confirm a diagnosis of urethritis, your health care provider will do the following:  Ask about your sexual history.  Perform a physical exam.  Have you provide a sample of your urine for lab testing.  Use a cotton swab to gently collect a sample from your urethra for lab testing. TREATMENT  It is important to treat urethritis. Depending on the cause, untreated urethritis may lead to serious genital infections and possibly infertility. Urethritis caused by a bacterial infection is treated with antibiotic medicine. All sexual partners must be treated.  HOME CARE INSTRUCTIONS  Do not have sex until the test results are known and treatment is completed, even if your symptoms go away before you finish  treatment.  If you were prescribed an antibiotic, finish it all even if you start to feel better. SEEK MEDICAL CARE IF:   Your symptoms are not improved in 3 days.  Your symptoms are getting worse.  You develop abdominal pain or pelvic pain (in women).  You develop joint pain.  You have a fever. SEEK IMMEDIATE MEDICAL CARE IF:   You have severe pain in the belly, back, or side.  You have repeated vomiting. MAKE SURE YOU:  Understand these instructions.  Will watch your condition.  Will get help right away if you are not doing well or get worse. Document Released: 05/23/2001 Document Revised: 04/13/2014 Document Reviewed: 07/28/2013 [facility] Patient Information 2015 [place], [place]. This information is not intended to replace advice given to you by your health care provider. Make sure you discuss any questions you have with your health care provider.   Safe Sex Safe sex is about reducing the risk of giving or getting a sexually transmitted disease (STD). STDs are spread through sexual contact involving the genitals, mouth, or rectum. Some STDs can be cured and others cannot. Safe sex can also prevent unintended pregnancies.  WHAT ARE SOME SAFE SEX PRACTICES?  Limit your sexual activity to only one partner who is having sex with only you.  Talk to your partner about his or her past partners, past STDs, and drug use.  Use a condom every time you have sexual intercourse. This includes vaginal, oral, and anal sexual activity. Both females and males should wear condoms during oral sex. Only use latex or polyurethane  condoms and water-based lubricants. Using petroleum-based lubricants or oils to lubricate a condom will weaken the condom and increase the chance that it will break. The condom should be in place from the beginning to the end of sexual activity. Wearing a condom reduces, but does not completely eliminate, your risk of getting or giving an STD. STDs can be spread by contact  with infected body fluids and skin.  Get vaccinated for hepatitis B and HPV.  Avoid alcohol and recreational drugs, which can affect your judgment. You may forget to use a condom or participate in high-risk sex.  For females, avoid douching after sexual intercourse. Douching can spread an infection farther into the reproductive tract.  Check your body for signs of sores, blisters, rashes, or unusual discharge. See your health care provider if you notice any of these signs.  Avoid sexual contact if you have symptoms of an infection or are being treated for an STD. If you or your partner has herpes, avoid sexual contact when blisters are present. Use condoms at all other times.  If you are at risk of being infected with HIV, it is recommended that you take a prescription medicine daily to prevent HIV infection. This is called pre-exposure prophylaxis (PrEP). You are considered at risk if:  You are a man who has sex with other men (MSM).  You are a heterosexual man or woman who is sexually active with more than one partner.  You take drugs by injection.  You are sexually active with a partner who has HIV.  Talk with your health care provider about whether you are at high risk of being infected with HIV. If you choose to begin PrEP, you should first be tested for HIV. You should then be tested every 3 months for as long as you are taking PrEP.  See your health care provider for regular screenings, exams, and tests for other STDs. Before having sex with a new partner, each of you should be screened for STDs and should talk about the results with each other. WHAT ARE THE BENEFITS OF SAFE SEX?   There is less chance of getting or giving an STD.  You can prevent unwanted or unintended pregnancies.  By discussing safe sex concerns with your partner, you may increase feelings of intimacy, comfort, trust, and honesty between the two of you. Document Released: 01/04/2005 Document Revised:  04/13/2014 Document Reviewed: 05/20/2012 [facility] Patient Information 2015 [place], [place]. This information is not intended to replace advice given to you by your health care provider. Make sure you discuss any questions you have with your health care provider.

## 2015-03-21 NOTE — ED Provider Notes (Signed)
CSN: [id number]     Arrival date & time 03/21/15  1931 History   First MD Initiated Contact with Patient 03/21/15 1943     Chief Complaint  Patient presents with  . SEXUALLY TRANSMITTED DISEASE     (Consider location/radiation/quality/duration/timing/severity/associated sxs/prior Treatment) The history is provided by the patient.  pt c/o penile discharge, and burning at end of penis w urination for the past few days. Symptoms persistent, constant. Denies hx same. +recent unprotected sex, no known std exposure. No abdominal pain. No flank pain. No nv. Does not feel ill or sick. Normal appetite. No fever or chills. No joint pain or rash. No scrotal or testicular pain or swelling.      Past Medical History  Diagnosis Date  . Hypertension   . Diabetes mellitus without complication   . Hyperlipidemia    Past Surgical History  Procedure Laterality Date  . Abcess drainage     No family history on file. History  Substance Use Topics  . Smoking status: Current Every Day Smoker  . Smokeless tobacco: Not on file  . Alcohol Use: No    Review of Systems  Constitutional: Negative for fever and chills.  Gastrointestinal: Negative for nausea, vomiting, abdominal pain and diarrhea.  Genitourinary: Positive for dysuria. Negative for flank pain, scrotal swelling, difficulty urinating and penile pain.  Musculoskeletal: Negative for back pain and arthralgias.  Skin: Negative for rash.      Allergies  Review of patient's allergies indicates no known allergies.  Home Medications   Prior to Admission medications   Medication Sig Start Date End Date Taking? Authorizing Provider  chlorhexidine (PERIDEX) 0.12 % solution Use as directed 15 mLs in the mouth or throat 2 (two) times daily. Patient not taking: Reported on 02/11/2015 03/07/14   Fayrene Helper, PA-C  HYDROcodone-acetaminophen (NORCO) 5-325 MG per tablet Take 1-2 tablets by mouth every 4 (four) hours as needed for severe pain. 02/11/15    Loren Racer, MD  lansoprazole (PREVACID) 30 MG capsule Take 1 capsule (30 mg total) by mouth daily at 12 noon. 02/11/15   Loren Racer, MD  ondansetron (ZOFRAN ODT) 4 MG disintegrating tablet  ODT q4 hours prn nausea/vomit 02/11/15   Loren Racer, MD   BP 133/90 mmHg  Pulse 74  Temp(Src) 97.9 F (36.6 C) (Oral)  Resp 18  Ht  (1.854 m)  Wt 276 lb 6.4 oz (125.374 kg)  BMI 36.47 kg/m2  SpO2 100% Physical Exam  Constitutional: He is oriented to person, place, and time. He appears well-developed and well-nourished. No distress.  HENT:  Head: Atraumatic.  Eyes: Conjunctivae are normal. No scleral icterus.  Neck: Neck supple. No tracheal deviation present.  Cardiovascular: Normal rate.   Pulmonary/Chest: Effort normal. No accessory muscle usage. No respiratory distress.  Abdominal: Soft. Bowel sounds are normal. He exhibits no distension and no mass. There is no tenderness. There is no rebound and no guarding.  Genitourinary:  +whitish yellow, scant penile d/c.  No scrotal or testicular pain, swelling or tenderness. No ulcerations or skin lesions. No l/a.  No cva tenderness  Musculoskeletal: Normal range of motion. He exhibits no edema.  Neurological: He is alert and oriented to person, place, and time.  Skin: Skin is warm and dry. No rash noted. He is not diaphoretic.  Psychiatric: He has a normal mood and affect.  Nursing note and vitals reviewed.   ED Course  Procedures (including critical care time) Labs Review     MDM   Labs.  Gc/chlamydia sent.   Confirmed nkda w pt.  Rocephin im. zithromax po.  Reviewed nursing notes and prior charts for additional history.     Cathren LaineKevin Leanore Biggers, MD 03/22/15 [phone]

## 2015-03-22 LAB — GC/CHLAMYDIA PROBE AMP (~~LOC~~) NOT AT ARMC
CHLAMYDIA, DNA PROBE: NEGATIVE
NEISSERIA GONORRHEA: POSITIVE — AB

## 2015-03-24 ENCOUNTER — Telehealth (HOSPITAL_BASED_OUTPATIENT_CLINIC_OR_DEPARTMENT_OTHER): Payer: Self-pay | Admitting: Emergency Medicine

## 2016-02-17 ENCOUNTER — Emergency Department (HOSPITAL_COMMUNITY)
Admission: EM | Admit: 2016-02-17 | Discharge: 2016-02-17 | Disposition: A | Discharge status: Home / Self Care | Payer: Self-pay | Attending: Emergency Medicine | Admitting: Emergency Medicine

## 2016-02-17 ENCOUNTER — Encounter (HOSPITAL_COMMUNITY): Payer: Self-pay | Admitting: Emergency Medicine

## 2016-02-17 DIAGNOSIS — F17200 Nicotine dependence, unspecified, uncomplicated: Secondary | ICD-10-CM | POA: Insufficient documentation

## 2016-02-17 DIAGNOSIS — Z79899 Other long term (current) drug therapy: Secondary | ICD-10-CM | POA: Insufficient documentation

## 2016-02-17 DIAGNOSIS — I10 Essential (primary) hypertension: Secondary | ICD-10-CM | POA: Insufficient documentation

## 2016-02-17 DIAGNOSIS — J069 Acute upper respiratory infection, unspecified: Secondary | ICD-10-CM | POA: Insufficient documentation

## 2016-02-17 DIAGNOSIS — E119 Type 2 diabetes mellitus without complications: Secondary | ICD-10-CM | POA: Insufficient documentation

## 2016-02-17 MED ORDER — NAPROXEN 250 MG PO TABS: 250.0000 mg | ORAL_TABLET | Freq: Two times a day (BID) | ORAL | Status: DC

## 2016-02-17 MED ORDER — BENZONATATE 100 MG PO CAPS: 100.0000 mg | ORAL_CAPSULE | Freq: Three times a day (TID) | ORAL | Status: DC | PRN

## 2016-02-17 MED ORDER — CETIRIZINE HCL 10 MG PO TABS: 10.0000 mg | ORAL_TABLET | Freq: Every day | ORAL | Status: DC

## 2016-02-17 NOTE — Discharge Instructions (Signed)
Upper Respiratory Infection, Adult Most upper respiratory infections (URIs) are a viral infection of the air passages leading to the lungs. A URI affects the nose, throat, and upper air passages. The most common type of URI is nasopharyngitis and is typically referred to as "the common cold." URIs run their course and usually go away on their own. Most of the time, a URI does not require medical attention, but sometimes a bacterial infection in the upper airways can follow a viral infection. This is called a secondary infection. Sinus and middle ear infections are common types of secondary upper respiratory infections. Bacterial pneumonia can also complicate a URI. A URI can worsen asthma and chronic obstructive pulmonary disease (COPD). Sometimes, these complications can require emergency medical care and may be life threatening.  CAUSES Almost all URIs are caused by viruses. A virus is a type of germ and can spread from one person to another.  RISKS FACTORS You may be at risk for a URI if:   You smoke.   You have chronic heart or lung disease.  You have a weakened defense (immune) system.   You are very young or very old.   You have nasal allergies or asthma.  You work in crowded or poorly ventilated areas.  You work in health care facilities or schools. SIGNS AND SYMPTOMS  Symptoms typically develop 2-3 days after you come in contact with a cold virus. Most viral URIs last 7-10 days. However, viral URIs from the influenza virus (flu virus) can last 14-18 days and are typically more severe. Symptoms may include:   Runny or stuffy (congested) nose.   Sneezing.   Cough.   Sore throat.   Headache.   Fatigue.   Fever.   Loss of appetite.   Pain in your forehead, behind your eyes, and over your cheekbones (sinus pain).  Muscle aches.  DIAGNOSIS  Your health care provider may diagnose a URI by:  Physical exam.  Tests to check that your symptoms are not due to  another condition such as:  Strep throat.  Sinusitis.  Pneumonia.  Asthma. TREATMENT  A URI goes away on its own with time. It cannot be cured with medicines, but medicines may be prescribed or recommended to relieve symptoms. Medicines may help:  Reduce your fever.  Reduce your cough.  Relieve nasal congestion. HOME CARE INSTRUCTIONS   Take medicines only as directed by your health care provider.   Gargle warm saltwater or take cough drops to comfort your throat as directed by your health care provider.  Use a warm mist humidifier or inhale steam from a shower to increase air moisture. This may make it easier to breathe.  Drink enough fluid to keep your urine clear or pale yellow.   Eat soups and other clear broths and maintain good nutrition.   Rest as needed.   Return to work when your temperature has returned to normal or as your health care provider advises. You may need to stay home longer to avoid infecting others. You can also use a face mask and careful hand washing to prevent spread of the virus.  Increase the usage of your inhaler if you have asthma.   Do not use any tobacco products, including cigarettes, chewing tobacco, or electronic cigarettes. If you need help quitting, ask your health care provider. PREVENTION  The best way to protect yourself from getting a cold is to practice good hygiene.   Avoid oral or hand contact with people with cold   symptoms.   Wash your hands often if contact occurs.  There is no clear evidence that vitamin C, vitamin E, echinacea, or exercise reduces the chance of developing a cold. However, it is always recommended to get plenty of rest, exercise, and practice good nutrition.  SEEK MEDICAL CARE IF:   You are getting worse rather than better.   Your symptoms are not controlled by medicine.   You have chills.  You have worsening shortness of breath.  You have brown or red mucus.  You have yellow or brown nasal  discharge.  You have pain in your face, especially when you bend forward.  You have a fever.  You have swollen neck glands.  You have pain while swallowing.  You have white areas in the back of your throat. SEEK IMMEDIATE MEDICAL CARE IF:   You have severe or persistent:  Headache.  Ear pain.  Sinus pain.  Chest pain.  You have chronic lung disease and any of the following:  Wheezing.  Prolonged cough.  Coughing up blood.  A change in your usual mucus.  You have a stiff neck.  You have changes in your:  Vision.  Hearing.  Thinking.  Mood. MAKE SURE YOU:   Understand these instructions.  Will watch your condition.  Will get help right away if you are not doing well or get worse.   This information is not intended to replace advice given to you by your health care provider. Make sure you discuss any questions you have with your health care provider.   Document Released: 05/23/2001 Document Revised: 04/13/2015 Document Reviewed: 03/04/2014 Elsevier Interactive Patient Education 2016 [employer]  

## 2016-02-17 NOTE — ED Notes (Signed)
Patient ambulated independently.

## 2016-02-17 NOTE — ED Notes (Signed)
Pt reports cough and chills since Monday. Also c.o generalized fatigue.

## 2016-02-17 NOTE — ED Provider Notes (Signed)
CSN: [id number]     Arrival date & time 02/17/16  1735 History  By signing my name below, I, Soijett Blue, attest that this documentation has been prepared under the direction and in the presence of Will Cinzia Devos, PA-C Electronically Signed: Soijett Blue, ED Scribe. 02/17/2016. 6:50 PM.   Chief Complaint  Patient presents with  . Cough      The history is provided by the patient. No language interpreter was used.    Michael Blankenship is a 30 y.o. male who presents to the Emergency Department complaining of constant productive cough onset 4 days. Pt has sick contacts of his niece. Pt denies getting a flu shot this past year. He is having associated symptoms of chills, subjective fever, mild wheezing, and generalized body aches. He states that he has tried theralfu with his last dose being 7 AM without tylenol or ibuprofen with no relief for his symptoms. He denies rhinorrhea, post-nasal drip, nasal congestion, SOB, n/v, dysuria, hematuria, rash, ear pain, sore throat, and any other symptoms. Pt notes that he smokes 1 PPD. Pt denies known PMHx of DM.   Past Medical History  Diagnosis Date  . Hypertension   . Diabetes mellitus without complication (HCC)   . Hyperlipidemia    Past Surgical History  Procedure Laterality Date  . Abcess drainage     No family history on file. Social History  Substance Use Topics  . Smoking status: Current Every Day Smoker  . Smokeless tobacco: None  . Alcohol Use: No    Review of Systems  Constitutional: Positive for fever (subjective), chills and fatigue.  HENT: Negative for congestion, ear pain, postnasal drip, rhinorrhea, sore throat and trouble swallowing.   Eyes: Negative for visual disturbance.  Respiratory: Positive for cough and wheezing (mild). Negative for shortness of breath.   Cardiovascular: Negative for chest pain.  Gastrointestinal: Negative for nausea, vomiting and abdominal pain.  Genitourinary: Negative for dysuria and hematuria.   Musculoskeletal: Positive for myalgias. Negative for neck pain and neck stiffness.  Skin: Negative for rash.  Neurological: Negative for syncope, light-headedness and headaches.      Allergies  Review of patient's allergies indicates no known allergies.  Home Medications   Prior to Admission medications   Medication Sig Start Date End Date Taking? Authorizing Provider  benzonatate (TESSALON) 100 MG capsule Take 1 capsule (100 mg total) by mouth 3 (three) times daily as needed for cough. 02/17/16   Everlene FarrierWilliam Allayna Erlich, PA-C  cetirizine (ZYRTEC ALLERGY) 10 MG tablet Take 1 tablet (10 mg total) by mouth daily. 02/17/16   Everlene FarrierWilliam Jacee Enerson, PA-C  chlorhexidine (PERIDEX) 0.12 % solution Use as directed 15 mLs in the mouth or throat 2 (two) times daily. Patient not taking: Reported on 02/11/2015 03/07/14   Fayrene HelperBowie Tran, PA-C  HYDROcodone-acetaminophen (NORCO) 5-325 MG per tablet Take 1-2 tablets by mouth every 4 (four) hours as needed for severe pain. 02/11/15   Loren Raceravid Yelverton, MD  lansoprazole (PREVACID) 30 MG capsule Take 1 capsule (30 mg total) by mouth daily at 12 noon. 02/11/15   Loren Raceravid Yelverton, MD  naproxen (NAPROSYN) 250 MG tablet Take 1 tablet (250 mg total) by mouth 2 (two) times daily with a meal. 02/17/16   Everlene FarrierWilliam Hoke Baer, PA-C  ondansetron (ZOFRAN ODT) 4 MG disintegrating tablet 4mg  ODT q4 hours prn nausea/vomit 02/11/15   Loren Raceravid Yelverton, MD   BP 141/73 mmHg  Pulse 74  Temp(Src) 97.9 F (36.6 C) (Oral)  Resp 18  Ht 6\' 1"  (1.854 m)  Wt  135.127 kg  BMI 39.31 kg/m2  SpO2 95% Physical Exam  Constitutional: He is oriented to person, place, and time. He appears well-developed and well-nourished. No distress.  HENT:  Head: Normocephalic and atraumatic.  Right Ear: Tympanic membrane, external ear and ear canal normal.  Left Ear: Tympanic membrane, external ear and ear canal normal.  Mouth/Throat: Uvula is midline, oropharynx is clear and moist and mucous membranes are normal. No oropharyngeal  exudate, posterior oropharyngeal edema or posterior oropharyngeal erythema.  Boggy nasal turbinates bilaterally.  Eyes: Conjunctivae are normal. Pupils are equal, round, and reactive to light. Right eye exhibits no discharge. Left eye exhibits no discharge.  Neck: Normal range of motion. Neck supple. No JVD present. No tracheal deviation present.  Cardiovascular: Normal rate, regular rhythm, normal heart sounds and intact distal pulses.   Pulmonary/Chest: Effort normal and breath sounds normal. No respiratory distress. He has no wheezes. He has no rales.  Lungs clear to ausculation bilaterally  Abdominal: Soft. Bowel sounds are normal. There is no tenderness. There is no guarding.  Musculoskeletal: He exhibits no edema.  Lymphadenopathy:    He has no cervical adenopathy.  Neurological: He is alert and oriented to person, place, and time. Coordination normal.  Skin: Skin is warm and dry. No rash noted. He is not diaphoretic. No erythema. No pallor.  Psychiatric: He has a normal mood and affect. His behavior is normal.  Nursing note and vitals reviewed.   ED Course  Procedures (including critical care time) DIAGNOSTIC STUDIES: Oxygen Saturation is 95% on RA, adequate by my interpretation.    COORDINATION OF CARE: 6:49 PM Discussed treatment plan with pt at bedside which includes naprosyn Rx, tessalon perles Rx, zyrtec Rx, and referral and follow up with [facility] and pt agreed to plan.    Labs Review Labs Reviewed - No data to display  Imaging Review No results found.    EKG Interpretation None      Filed Vitals:   02/17/16 1832  BP: 141/73  Pulse: 74  Temp: 97.9 F (36.6 C)  TempSrc: Oral  Resp: 18  Height:  (1.854 m)  Weight: 135.127 kg  SpO2: 95%     MDM   Meds given in ED:  Medications - No data to display  Discharge Medication List as of 02/17/2016  6:51 PM    START taking these medications   Details  benzonatate (TESSALON) 100  MG capsule Take 1 capsule (100 mg total) by mouth 3 (three) times daily as needed for cough., Starting 02/17/2016, Until Discontinued, Print    cetirizine (ZYRTEC ALLERGY) 10 MG tablet Take 1 tablet (10 mg total) by mouth daily., Starting 02/17/2016, Until Discontinued, Print    naproxen (NAPROSYN) 250 MG tablet Take 1 tablet (250 mg total) by mouth 2 (two) times daily with a meal., Starting 02/17/2016, Until Discontinued, Print        Final diagnoses:  URI (upper respiratory infection)   This is a 30 y.o. male who presents to the Emergency Department complaining of constant productive cough onset 4 days. Pt has sick contacts of his niece. Pt denies getting a flu shot this past year. He is having associated symptoms of chills, subjective fever, mild wheezing, and generalized body aches. He states that he has tried theralfu with his last dose being 7 AM without tylenol or ibuprofen with no relief for his symptoms.  On exam patient is afebrile and nontoxic-appearing. His abdomen is soft and nontender to palpation. Lungs  are clear to auscultation bilaterally. No wheezing or rhonchi noted. He has boggy nasal turbinates bilaterally. Patient with upper rash or infection. We'll discharge her prescriptions for Tessalon Perles, Zyrtec and naproxen. I see no need for chest x-ray at this time. I discussed strict and specific return precautions. I advised the patient to follow-up with their primary care provider this week. I advised the patient to return to the emergency department with new or worsening symptoms or new concerns. The patient verbalized understanding and agreement with plan.    I personally performed the services described in this documentation, which was scribed in my presence. The recorded information has been reviewed and is accurate.       Everlene Farrier, PA-C 02/17/16 2012  Pricilla Loveless, MD 02/24/16 [phone]

## 2016-04-07 ENCOUNTER — Emergency Department (HOSPITAL_COMMUNITY)
Admission: EM | Admit: 2016-04-07 | Discharge: 2016-04-07 | Disposition: A | Discharge status: Home / Self Care | Payer: Self-pay | Attending: Emergency Medicine | Admitting: Emergency Medicine

## 2016-04-07 ENCOUNTER — Emergency Department (HOSPITAL_COMMUNITY): Payer: Self-pay

## 2016-04-07 ENCOUNTER — Encounter (HOSPITAL_COMMUNITY): Payer: Self-pay

## 2016-04-07 DIAGNOSIS — Z791 Long term (current) use of non-steroidal anti-inflammatories (NSAID): Secondary | ICD-10-CM | POA: Insufficient documentation

## 2016-04-07 DIAGNOSIS — R1084 Generalized abdominal pain: Secondary | ICD-10-CM | POA: Insufficient documentation

## 2016-04-07 DIAGNOSIS — F17200 Nicotine dependence, unspecified, uncomplicated: Secondary | ICD-10-CM | POA: Insufficient documentation

## 2016-04-07 DIAGNOSIS — E119 Type 2 diabetes mellitus without complications: Secondary | ICD-10-CM | POA: Insufficient documentation

## 2016-04-07 DIAGNOSIS — Z79899 Other long term (current) drug therapy: Secondary | ICD-10-CM | POA: Insufficient documentation

## 2016-04-07 DIAGNOSIS — I10 Essential (primary) hypertension: Secondary | ICD-10-CM | POA: Insufficient documentation

## 2016-04-07 LAB — URINALYSIS, ROUTINE W REFLEX MICROSCOPIC
Bilirubin Urine: NEGATIVE
GLUCOSE, UA: NEGATIVE mg/dL
Hgb urine dipstick: NEGATIVE
KETONES UR: NEGATIVE mg/dL
Nitrite: NEGATIVE
Protein, ur: NEGATIVE mg/dL
Specific Gravity, Urine: 1.028 (ref 1.005–1.030)
pH: 6 (ref 5.0–8.0)

## 2016-04-07 LAB — COMPREHENSIVE METABOLIC PANEL
ALK PHOS: 67 U/L (ref 38–126)
ALT: 21 U/L (ref 17–63)
AST: 23 U/L (ref 15–41)
Albumin: 3.8 g/dL (ref 3.5–5.0)
Anion gap: 8 (ref 5–15)
BILIRUBIN TOTAL: 0.6 mg/dL (ref 0.3–1.2)
BUN: 6 mg/dL (ref 6–20)
CHLORIDE: 106 mmol/L (ref 101–111)
CO2: 24 mmol/L (ref 22–32)
CREATININE: 1.11 mg/dL (ref 0.61–1.24)
Calcium: 9.4 mg/dL (ref 8.9–10.3)
Glucose, Bld: 116 mg/dL — ABNORMAL HIGH (ref 65–99)
Potassium: 3.4 mmol/L — ABNORMAL LOW (ref 3.5–5.1)
Sodium: 138 mmol/L (ref 135–145)
TOTAL PROTEIN: 7 g/dL (ref 6.5–8.1)

## 2016-04-07 LAB — LIPASE, BLOOD: Lipase: 36 U/L (ref 11–51)

## 2016-04-07 LAB — CBC
HCT: 46.1 % (ref 39.0–52.0)
Hemoglobin: 15.4 g/dL (ref 13.0–17.0)
MCH: 29.4 pg (ref 26.0–34.0)
MCHC: 33.4 g/dL (ref 30.0–36.0)
MCV: 88 fL (ref 78.0–100.0)
Platelets: 207 10*3/uL (ref 150–400)
RBC: 5.24 MIL/uL (ref 4.22–5.81)
RDW: 13.8 % (ref 11.5–15.5)
WBC: 8.8 10*3/uL (ref 4.0–10.5)

## 2016-04-07 LAB — URINE MICROSCOPIC-ADD ON: RBC / HPF: NONE SEEN RBC/hpf (ref 0–5)

## 2016-04-07 MED ORDER — LANSOPRAZOLE 30 MG PO CPDR: 30.0000 mg | DELAYED_RELEASE_CAPSULE | Freq: Every day | ORAL | Status: DC

## 2016-04-07 MED ORDER — GI COCKTAIL ~~LOC~~
30.0000 mL | Freq: Once | ORAL | Status: AC
  Administered 2016-04-07: 30 mL via ORAL
  Filled 2016-04-07: qty 30

## 2016-04-07 NOTE — ED Provider Notes (Signed)
CSN: [id number]     Arrival date & time 04/07/16  1759 History   First MD Initiated Contact with Patient 04/07/16 2057     Chief Complaint  Patient presents with  . Abdominal Pain  PT IS HERE TODAY WITH ABDOMINAL PAIN.  THE PT HAS UPPER ABDOMINAL PAIN THAT WAS TOO BAD FOR HIM TO GO TO WORK.  HE SAID THAT IT STARTED A FEW DAYS AGO AND HE IS NAUSEOUS.   (Consider location/radiation/quality/duration/timing/severity/associated sxs/prior Treatment) Patient is a 30 y.o. male presenting with abdominal pain. The history is provided by the patient.  Abdominal Pain Pain location:  Epigastric Pain quality: burning   Pain radiates to:  Does not radiate Pain severity:  Mild Onset quality:  Sudden Timing:  Constant Progression:  Worsening   Past Medical History  Diagnosis Date  . Hypertension   . Diabetes mellitus without complication (HCC)   . Hyperlipidemia    Past Surgical History  Procedure Laterality Date  . Abcess drainage     No family history on file. Social History  Substance Use Topics  . Smoking status: Current Every Day Smoker  . Smokeless tobacco: None  . Alcohol Use: No    Review of Systems  Gastrointestinal: Positive for abdominal pain.  All other systems reviewed and are negative.     Allergies  Review of patient's allergies indicates no known allergies.  Home Medications   Prior to Admission medications   Medication Sig Start Date End Date Taking? Authorizing Provider  Cholecalciferol (VITAMIN D3) 5000 units CAPS Take 5,000 Units by mouth daily.   Yes Historical Provider, MD  benzonatate (TESSALON) 100 MG capsule Take 1 capsule (100 mg total) by mouth 3 (three) times daily as needed for cough. 02/17/16   Everlene FarrierWilliam Dansie, PA-C  cetirizine (ZYRTEC ALLERGY) 10 MG tablet Take 1 tablet (10 mg total) by mouth daily. 02/17/16   Everlene FarrierWilliam Dansie, PA-C  chlorhexidine (PERIDEX) 0.12 % solution Use as directed 15 mLs in the mouth or throat 2 (two) times daily. Patient not  taking: Reported on 02/11/2015 03/07/14   Fayrene HelperBowie Tran, PA-C  HYDROcodone-acetaminophen (NORCO) 5-325 MG per tablet Take 1-2 tablets by mouth every 4 (four) hours as needed for severe pain. 02/11/15   Loren Raceravid Yelverton, MD  lansoprazole (PREVACID) 30 MG capsule Take 1 capsule (30 mg total) by mouth daily at 12 noon. 04/07/16   Jacalyn LefevreJulie Kais Monje, MD  naproxen (NAPROSYN) 250 MG tablet Take 1 tablet (250 mg total) by mouth 2 (two) times daily with a meal. 02/17/16   Everlene FarrierWilliam Dansie, PA-C  ondansetron (ZOFRAN ODT) 4 MG disintegrating tablet 4mg  ODT q4 hours prn nausea/vomit 02/11/15   Loren Raceravid Yelverton, MD   BP 157/93 mmHg  Pulse 80  Temp(Src) 98.8 F (37.1 C) (Oral)  Resp 17  SpO2 99% Physical Exam  Constitutional: He is oriented to person, place, and time. He appears well-developed and well-nourished.  HENT:  Head: Normocephalic and atraumatic.  Right Ear: External ear normal.  Left Ear: External ear normal.  Nose: Nose normal.  Mouth/Throat: Oropharynx is clear and moist.  Eyes: Conjunctivae are normal. Pupils are equal, round, and reactive to light.  Neck: Normal range of motion. Neck supple.  Cardiovascular: Normal rate, regular rhythm, normal heart sounds and intact distal pulses.   Pulmonary/Chest: Effort normal and breath sounds normal.  Abdominal: Soft. Bowel sounds are normal. There is tenderness.    Musculoskeletal: Normal range of motion.  Neurological: He is alert and oriented to person, place, and time.  Skin:  Skin is warm and dry.  Psychiatric: He has a normal mood and affect. His behavior is normal.  Nursing note and vitals reviewed.   ED Course  Procedures (including critical care time) Labs Review Labs Reviewed  COMPREHENSIVE METABOLIC PANEL - Abnormal; Notable for the following:    Potassium 3.4 (*)    Glucose, Bld 116 (*)    All other components within normal limits  URINALYSIS, ROUTINE W REFLEX MICROSCOPIC (NOT AT [facility]) - Abnormal; Notable for the following:    Color, Urine  AMBER (*)    Leukocytes, UA SMALL (*)    All other components within normal limits  URINE MICROSCOPIC-ADD ON - Abnormal; Notable for the following:    Squamous Epithelial / LPF 0-5 (*)    Bacteria, UA RARE (*)    All other components within normal limits  LIPASE, BLOOD  CBC    Imaging Review Dg Abd Acute W/chest  04/07/2016  CLINICAL DATA:  30 year old male with left upper quadrant abdominal pain. Diarrhea for 1 month. EXAM: DG ABDOMEN ACUTE W/ 1V CHEST COMPARISON:  Acute abdominal series 3 02/2015. FINDINGS: Lung volumes are normal. No consolidative airspace disease. No pleural effusions. No pneumothorax. No pulmonary nodule or mass noted. Pulmonary vasculature and the cardiomediastinal silhouette are within normal limits. Gas and stool are noted throughout the colon extending to the level of the distal rectum. There are some prominent but non pathologically dilated loops of small bowel in the central and left upper quadrant of the abdomen measuring up to 3.6 cm in diameter (nonspecific). No pathologic dilatation of small bowel. No pneumoperitoneum. IMPRESSION: 1. Nonspecific bowel gas pattern, as above, which could indicate underlying enteritis. No findings to suggest obstruction. 2. No pneumoperitoneum. 3. No radiographic evidence of acute cardiopulmonary disease. Electronically Signed   By: Trudie Reed M.D.   On: 04/07/2016 21:54   I have personally reviewed and evaluated these images and lab results as part of my medical decision-making.   EKG Interpretation None      MDM  Pt is feeling better.  Return if worse.  F/u with gi. Final diagnoses:  Generalized abdominal pain       Jacalyn Lefevre, MD 04/07/16 2223

## 2016-04-07 NOTE — Discharge Instructions (Signed)

## 2016-04-07 NOTE — ED Notes (Signed)
Pt reports mid upper abdominal pain/epigastric pain that started on Monday associated with nausea. Hx of similar symptoms last year and was told he had gastritis. He denies CP.

## 2016-12-29 ENCOUNTER — Encounter (HOSPITAL_COMMUNITY): Payer: Self-pay

## 2016-12-29 ENCOUNTER — Emergency Department (HOSPITAL_COMMUNITY): Payer: Self-pay

## 2016-12-29 ENCOUNTER — Emergency Department (HOSPITAL_COMMUNITY)
Admission: EM | Admit: 2016-12-29 | Discharge: 2016-12-29 | Disposition: A | Discharge status: Home / Self Care | Payer: Self-pay | Attending: Emergency Medicine | Admitting: Emergency Medicine

## 2016-12-29 DIAGNOSIS — F17200 Nicotine dependence, unspecified, uncomplicated: Secondary | ICD-10-CM | POA: Insufficient documentation

## 2016-12-29 DIAGNOSIS — I10 Essential (primary) hypertension: Secondary | ICD-10-CM | POA: Insufficient documentation

## 2016-12-29 DIAGNOSIS — R531 Weakness: Secondary | ICD-10-CM | POA: Insufficient documentation

## 2016-12-29 DIAGNOSIS — Z79899 Other long term (current) drug therapy: Secondary | ICD-10-CM | POA: Insufficient documentation

## 2016-12-29 DIAGNOSIS — E119 Type 2 diabetes mellitus without complications: Secondary | ICD-10-CM | POA: Insufficient documentation

## 2016-12-29 LAB — BASIC METABOLIC PANEL
Anion gap: 9 (ref 5–15)
BUN: 9 mg/dL (ref 6–20)
CO2: 26 mmol/L (ref 22–32)
Calcium: 9.5 mg/dL (ref 8.9–10.3)
Chloride: 103 mmol/L (ref 101–111)
Creatinine, Ser: 0.95 mg/dL (ref 0.61–1.24)
GFR calc Af Amer: >60 mL/min (ref >60)
Glucose, Bld: 81 mg/dL (ref 65–99)
Potassium: 4.2 mmol/L (ref 3.5–5.1)
SODIUM: 138 mmol/L (ref 135–145)

## 2016-12-29 LAB — CBC
HCT: 45.9 % (ref 39.0–52.0)
HEMOGLOBIN: 15.3 g/dL (ref 13.0–17.0)
MCH: 29.5 pg (ref 26.0–34.0)
MCHC: 33.3 g/dL (ref 30.0–36.0)
MCV: 88.6 fL (ref 78.0–100.0)
Platelets: 215 10*3/uL (ref 150–400)
RBC: 5.18 MIL/uL (ref 4.22–5.81)
RDW: 14.2 % (ref 11.5–15.5)
WBC: 7.8 10*3/uL (ref 4.0–10.5)

## 2016-12-29 LAB — I-STAT TROPONIN, ED: Troponin i, poc: 0 ng/mL (ref 0.00–0.08)

## 2016-12-29 LAB — CBG MONITORING, ED: GLUCOSE-CAPILLARY: 92 mg/dL (ref 65–99)

## 2016-12-29 NOTE — ED Provider Notes (Signed)
[facility]-EMERGENCY DEPT Provider Note   CSN: [id number] Arrival date & time: 12/29/16  1101     History   Chief Complaint Chief Complaint  Patient presents with  . Weakness  . Near Syncope    HPI Michael Blankenship is a 31 y.o. male.  HPI  This is a 31 year old male with No past medical history who comes in today complaining that he has been lightheaded intermittently over the past week. 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 He describes this as some spinning sensation. He is not a lateralized weakness, vision changes, difficulty speaking, difficulty swallowing, chest pain, or dyspnea. He is otherwise been in his normal state of health. He has felt a few chills. He has not had any nasal congestion, cough, fever, nausea, vomiting, diarrhea, or weight loss. He states he does feel he has had some increased thirst and increased urination. He has a family history of diabetes and is concerned that he could have diabetes or high blood pressure. He does not currently have a primary care doctor but states that he is planning on getting when the symptoms he gets his insurance card.  Past Medical History:  Diagnosis Date  . Diabetes mellitus without complication (HCC)   . Hyperlipidemia   . Hypertension     There are no active problems to display for this patient.   Past Surgical History:  Procedure Laterality Date  . ABCESS DRAINAGE         Home Medications    Prior to Admission medications   Medication Sig Start Date End Date Taking? Authorizing Provider  benzonatate (TESSALON) 100 MG capsule Take 1 capsule (100 mg total) by mouth 3 (three) times daily as needed for cough. 02/17/16   Everlene Farrier, PA-C  cetirizine (ZYRTEC ALLERGY) 10 MG tablet Take 1 tablet (10 mg total) by mouth daily. 02/17/16   Everlene Farrier, PA-C  chlorhexidine (PERIDEX) 0.12 % solution Use as directed 15 mLs in the mouth or throat 2 (two) times daily. Patient not taking: Reported on  02/11/2015 03/07/14   Fayrene Helper, PA-C  Cholecalciferol (VITAMIN D3) 5000 units CAPS Take 5,000 Units by mouth daily.    Historical Provider, MD  HYDROcodone-acetaminophen (NORCO) 5-325 MG per tablet Take 1-2 tablets by mouth every 4 (four) hours as needed for severe pain. 02/11/15   Loren Racer, MD  lansoprazole (PREVACID) 30 MG capsule Take 1 capsule (30 mg total) by mouth daily at 12 noon. 04/07/16   Jacalyn Lefevre, MD  naproxen (NAPROSYN) 250 MG tablet Take 1 tablet (250 mg total) by mouth 2 (two) times daily with a meal. 02/17/16   Everlene Farrier, PA-C  ondansetron (ZOFRAN ODT) 4 MG disintegrating tablet 4mg  ODT q4 hours prn nausea/vomit 02/11/15   Loren Racer, MD    Family History No family history on file.  Social History Social History  Substance Use Topics  . Smoking status: Current Every Day Smoker  . Smokeless tobacco: Never Used  . Alcohol use No     Allergies   Patient has no known allergies.   Review of Systems Review of Systems  All other systems reviewed and are negative.    Physical Exam Updated Vital Signs BP (!) 125/102   Pulse 68   Temp 97.5 F (36.4 C)   Resp 20   Ht 6\' 1"  (1.854 m)   Wt 134.7 kg   SpO2 100%   BMI 39.18 kg/m   Physical Exam  Constitutional: He is oriented to  person, place, and time. He appears well-developed and well-nourished.  HENT:  Head: Normocephalic and atraumatic.  Right Ear: External ear normal.  Left Ear: External ear normal.  Nose: Nose normal.  Mouth/Throat: Oropharynx is clear and moist.  Eyes: Conjunctivae and EOM are normal. Pupils are equal, round, and reactive to light.  Neck: Normal range of motion. Neck supple.  Cardiovascular: Normal rate, regular rhythm, normal heart sounds and intact distal pulses.   Pulmonary/Chest: Effort normal and breath sounds normal. No respiratory distress. He has no wheezes. He exhibits no tenderness.  Abdominal: Soft. Bowel sounds are normal. He exhibits no distension and no mass.  There is no tenderness. There is no guarding.  Musculoskeletal: Normal range of motion.  Neurological: He is alert and oriented to person, place, and time. He has normal reflexes. He exhibits normal muscle tone. Coordination normal.  Skin: Skin is warm and dry.  Psychiatric: He has a normal mood and affect. His behavior is normal. Judgment and thought content normal.  Nursing note and vitals reviewed.    ED Treatments / Results  Labs (all labs ordered are listed, but only abnormal results are displayed) Labs Reviewed  BASIC METABOLIC PANEL  CBC  CBG MONITORING, ED  I-STAT TROPOININ, ED    EKG  EKG Interpretation  Date/Time:  Friday December 29 2016 12:03:35 EST Ventricular Rate:  70 PR Interval:  166 QRS Duration: 100 QT Interval:  372 QTC Calculation: 401 R Axis:   93 Text Interpretation:  Normal sinus rhythm Rightward axis Borderline ECG No significant change since last tracing Confirmed by Afnan Cadiente MD, Duwayne HeckANIELLE [id number]) on 12/29/2016 1:25:54 PM       Radiology Dg Chest 2 View  Result Date: 12/29/2016 CLINICAL DATA:  Chest pain, dizziness EXAM: CHEST  2 VIEW COMPARISON:  04/07/2016 FINDINGS: The heart size and mediastinal contours are within normal limits. Both lungs are clear. The visualized skeletal structures are unremarkable. IMPRESSION: No active cardiopulmonary disease. Electronically Signed   By: Natasha MeadLiviu  Pop M.D.   On: 12/29/2016 12:40    Procedures Procedures (including critical care time)  Medications Ordered in ED Medications - No data to display   Initial Impression / Assessment and Plan / ED Course  I have reviewed the triage vital signs and the nursing notes.  Pertinent labs & imaging results that were available during my care of the patient were reviewed by me and considered in my medical decision making (see chart for details).     31 year old previously healthy male presents today with some lightheadedness, dizziness, increased thirst and has a normal  physical exam, laboratory values including CBC and chemistry. His EKG and troponin are normal. He denies any chest pain.  Final Clinical Impressions(s) / ED Diagnoses   Final diagnoses:  Weakness    New Prescriptions New Prescriptions   No medications on file     Margarita Grizzleanielle Lucifer Soja, MD 12/29/16 1341

## 2016-12-29 NOTE — ED Triage Notes (Signed)
Pt. Having periods of dizziness, weakness and feeling like he can pass out.  Pt. Is also having increased thirst, increased urinating and dry mouth.  Pt. Was work today and felt the worse he ever has.  Skin is warm and dry ,, Pt. Is alert and oriented X 4 Lt. Chest pain and heart fluttering, ECG completed in Triage

## 2017-02-09 IMAGING — CR DG ABDOMEN ACUTE W/ 1V CHEST
3 series · 3 of 3 positions shown · non-contrast
Comparison: None.

CLINICAL DATA: Upper abdominal pain for 2 days. Nausea and vomiting
last night.

EXAM:
ACUTE ABDOMEN SERIES (ABDOMEN 2 VIEW & CHEST 1 VIEW)

[chest pa]
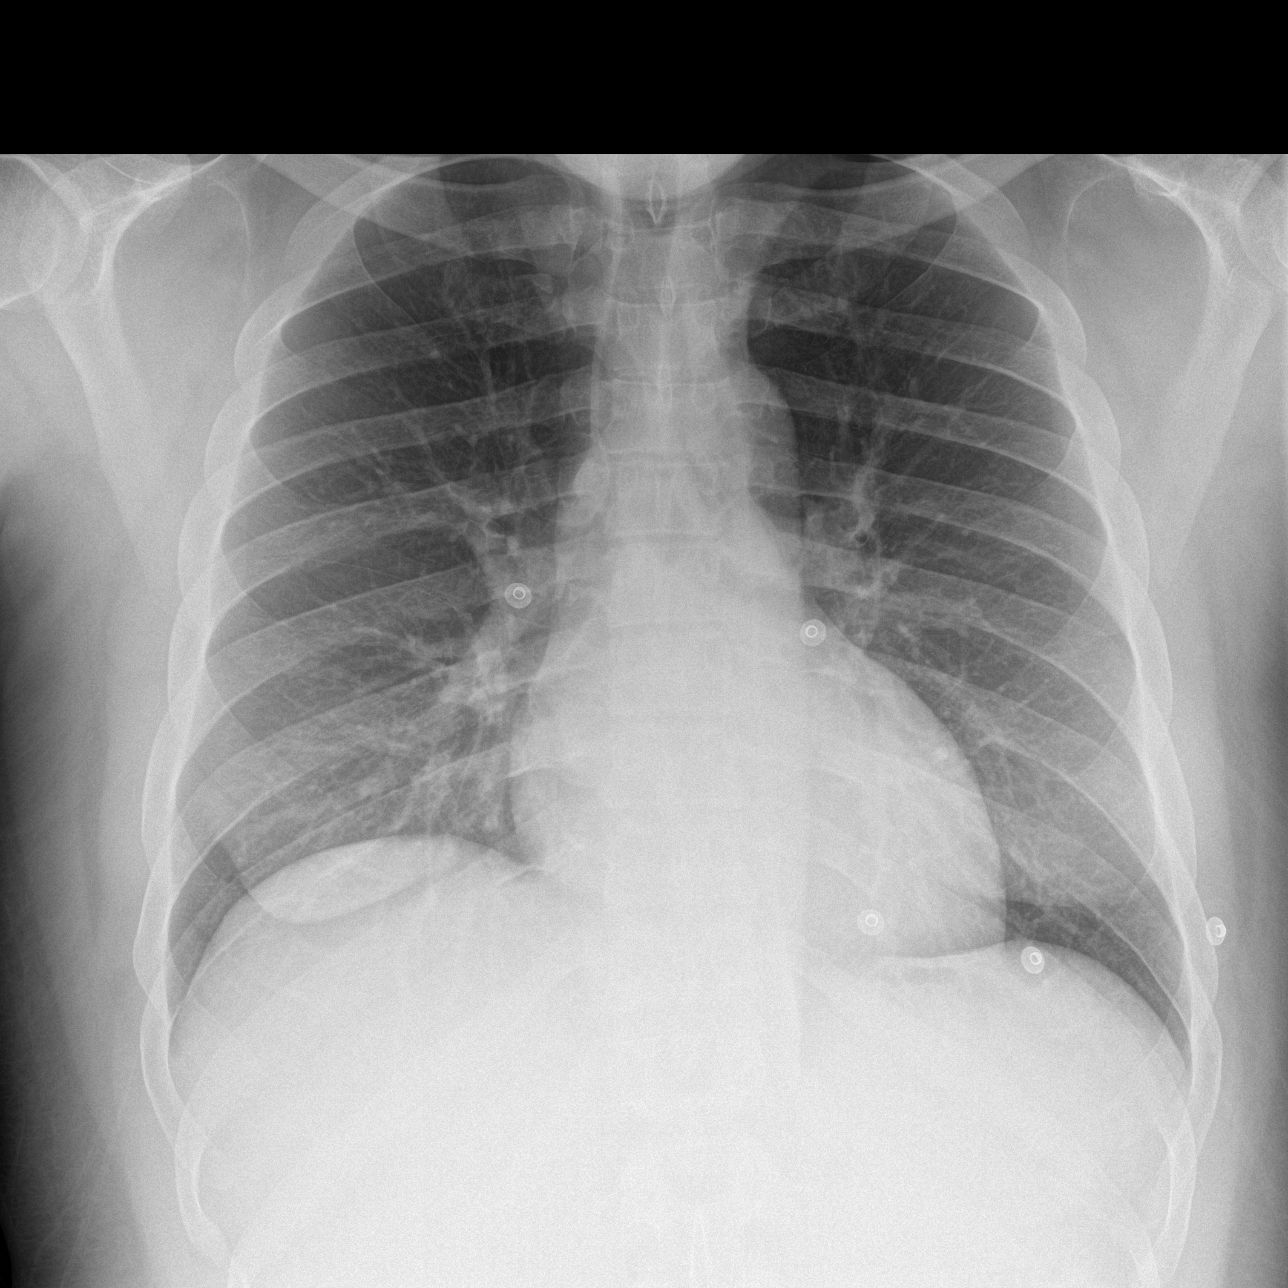

[abdomen erect]
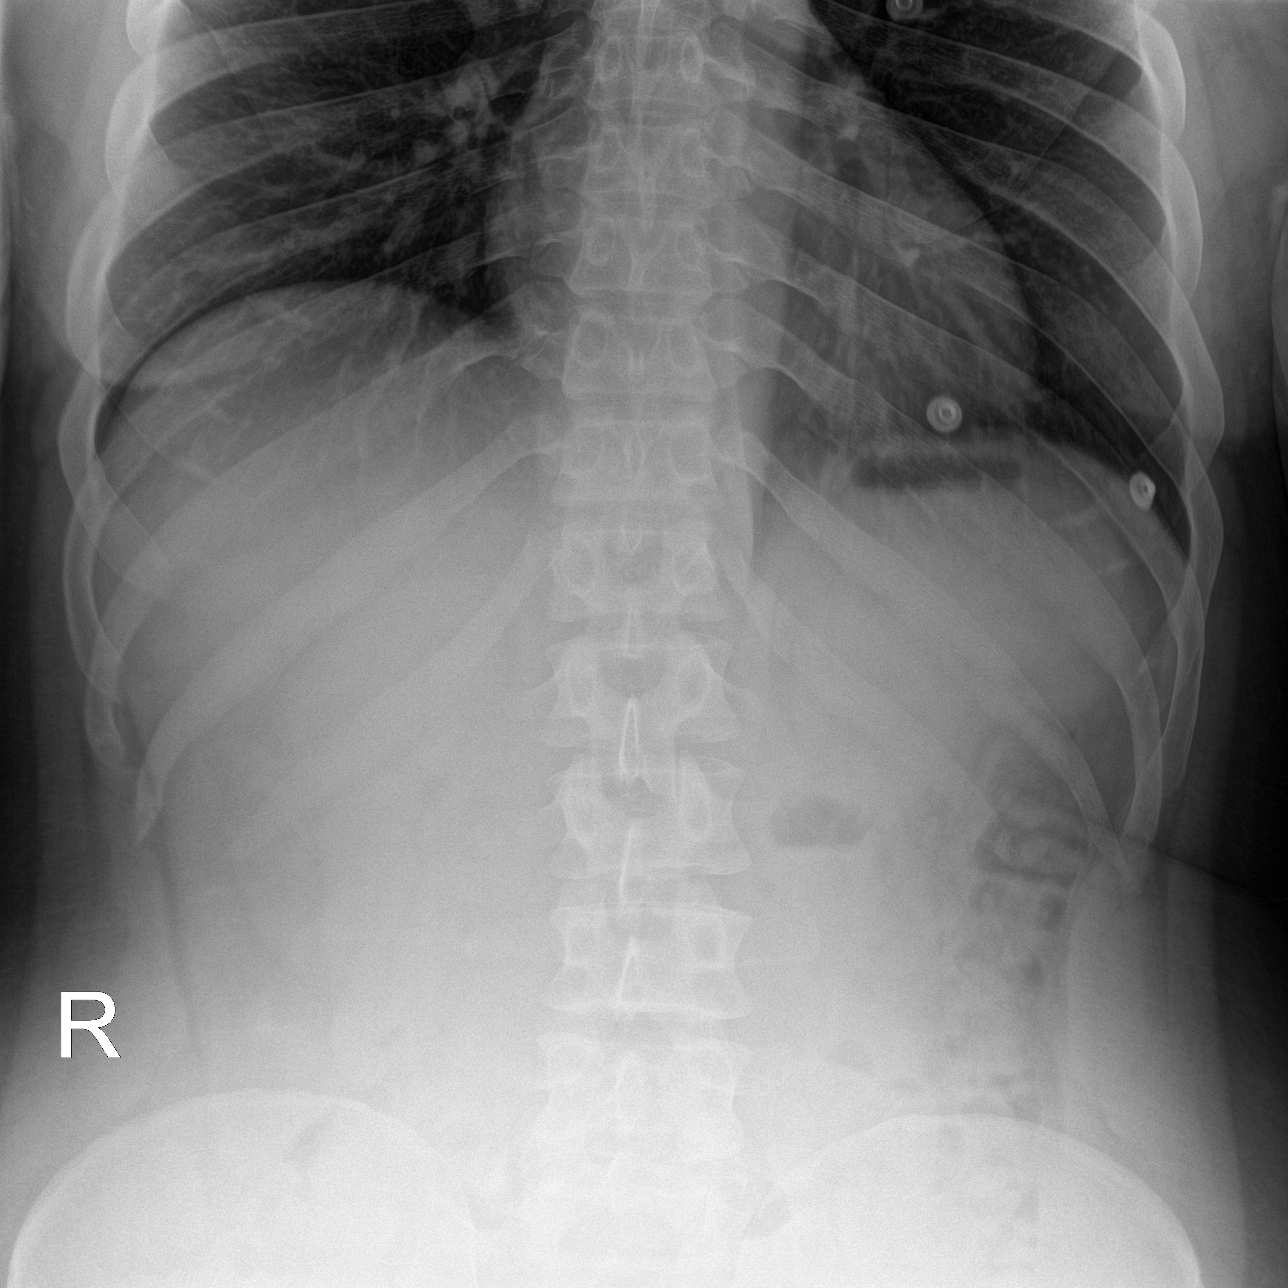

[abdomen supine]
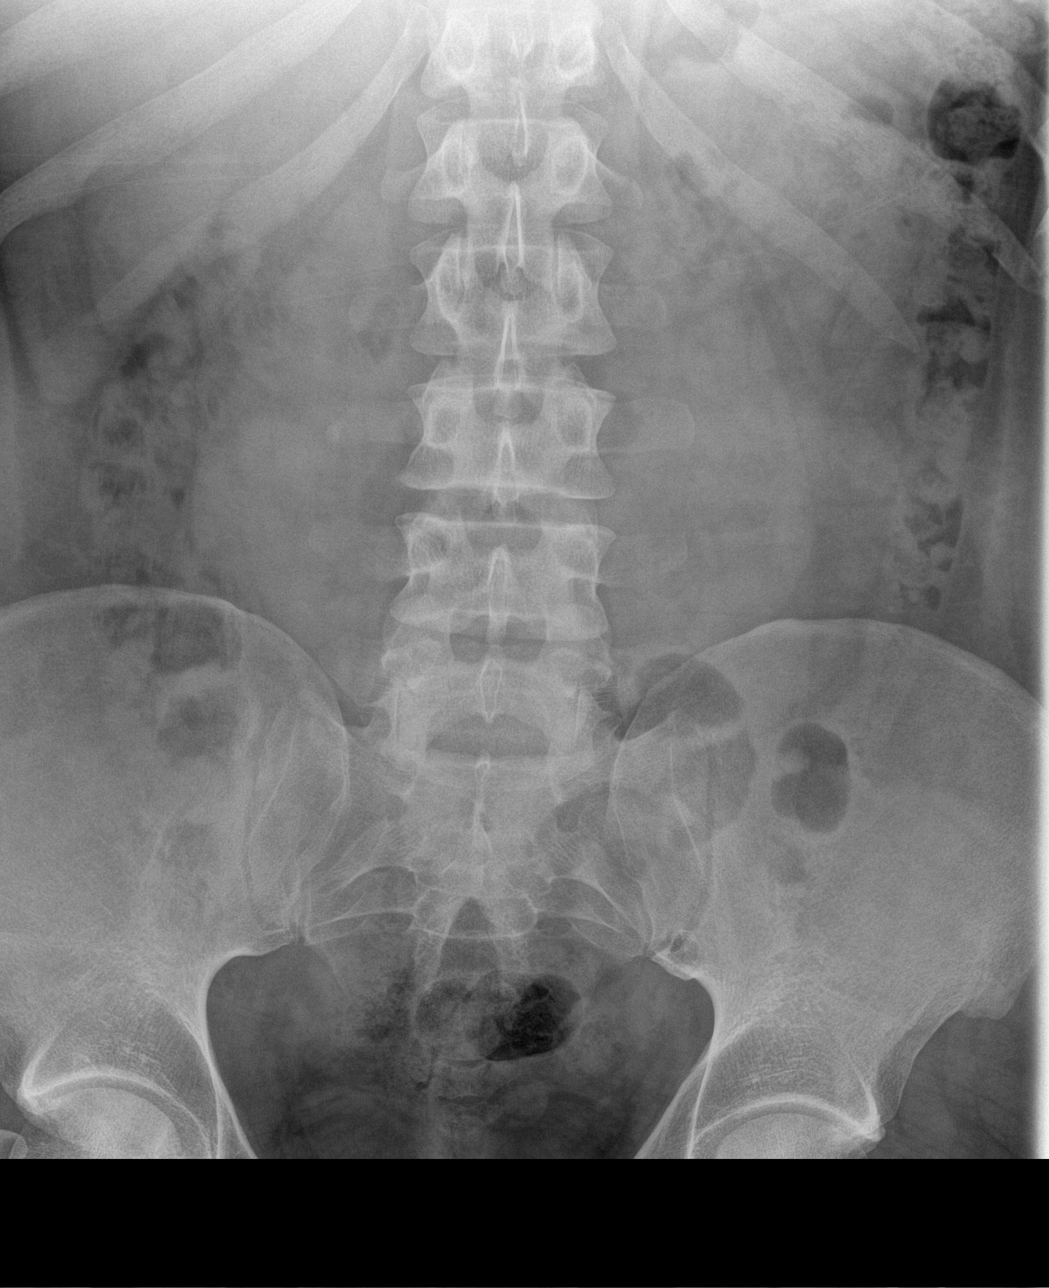

[3 of 3 positions shown; findings below may reference images not displayed]

FINDINGS: There is no evidence of dilated bowel loops or free intraperitoneal
air. No radiopaque calculi or other significant radiographic
abnormality is seen. Heart size and mediastinal contours are within
normal limits. Both lungs are clear.
IMPRESSION: Negative abdominal radiographs.  No acute cardiopulmonary disease.

## 2017-09-14 ENCOUNTER — Encounter (HOSPITAL_COMMUNITY): Payer: Self-pay

## 2017-09-14 ENCOUNTER — Emergency Department (HOSPITAL_COMMUNITY): Payer: Self-pay

## 2017-09-14 ENCOUNTER — Emergency Department (HOSPITAL_COMMUNITY)
Admission: EM | Admit: 2017-09-14 | Discharge: 2017-09-14 | Disposition: A | Discharge status: Home / Self Care | Payer: Self-pay | Attending: Emergency Medicine | Admitting: Emergency Medicine

## 2017-09-14 DIAGNOSIS — R0789 Other chest pain: Secondary | ICD-10-CM | POA: Insufficient documentation

## 2017-09-14 DIAGNOSIS — Z79899 Other long term (current) drug therapy: Secondary | ICD-10-CM | POA: Insufficient documentation

## 2017-09-14 DIAGNOSIS — F17210 Nicotine dependence, cigarettes, uncomplicated: Secondary | ICD-10-CM | POA: Insufficient documentation

## 2017-09-14 LAB — CBC
HCT: 45 % (ref 39.0–52.0)
HEMOGLOBIN: 15.7 g/dL (ref 13.0–17.0)
MCH: 30.2 pg (ref 26.0–34.0)
MCHC: 34.9 g/dL (ref 30.0–36.0)
MCV: 86.5 fL (ref 78.0–100.0)
PLATELETS: 203 10*3/uL (ref 150–400)
RBC: 5.2 MIL/uL (ref 4.22–5.81)
RDW: 13.6 % (ref 11.5–15.5)
WBC: 8.3 10*3/uL (ref 4.0–10.5)

## 2017-09-14 LAB — I-STAT TROPONIN, ED: Troponin i, poc: 0.01 ng/mL (ref 0.00–0.08)

## 2017-09-14 LAB — BASIC METABOLIC PANEL
ANION GAP: 8 (ref 5–15)
BUN: 9 mg/dL (ref 6–20)
CALCIUM: 9.5 mg/dL (ref 8.9–10.3)
CO2: 27 mmol/L (ref 22–32)
CREATININE: 1.09 mg/dL (ref 0.61–1.24)
Chloride: 104 mmol/L (ref 101–111)
GFR calc Af Amer: >60 mL/min (ref >60)
GFR calc non Af Amer: >60 mL/min (ref >60)
Glucose, Bld: 106 mg/dL — ABNORMAL HIGH (ref 65–99)
POTASSIUM: 3.9 mmol/L (ref 3.5–5.1)
Sodium: 139 mmol/L (ref 135–145)

## 2017-09-14 MED ORDER — IBUPROFEN 600 MG PO TABS: 600.0000 mg | ORAL_TABLET | Freq: Four times a day (QID) | ORAL | 0 refills | Status: DC | PRN

## 2017-09-14 MED ORDER — METHOCARBAMOL 1000 MG/10ML IJ SOLN
1000.0000 mg | Freq: Once | INTRAMUSCULAR | Status: DC
  Filled 2017-09-14: qty 10

## 2017-09-14 MED ORDER — KETOROLAC TROMETHAMINE 30 MG/ML IJ SOLN
30.0000 mg | Freq: Once | INTRAMUSCULAR | Status: AC
  Administered 2017-09-14: 30 mg via INTRAVENOUS
  Filled 2017-09-14: qty 1

## 2017-09-14 MED ORDER — KETOROLAC TROMETHAMINE 60 MG/2ML IM SOLN
60.0000 mg | Freq: Once | INTRAMUSCULAR | Status: DC
  Filled 2017-09-14: qty 2

## 2017-09-14 MED ORDER — METHOCARBAMOL 500 MG PO TABS: 500.0000 mg | ORAL_TABLET | Freq: Two times a day (BID) | ORAL | 0 refills | Status: DC

## 2017-09-14 MED ORDER — LORAZEPAM 2 MG/ML IJ SOLN
1.0000 mg | Freq: Once | INTRAMUSCULAR | Status: DC
  Filled 2017-09-14: qty 1

## 2017-09-14 NOTE — ED Notes (Signed)
Bed: ZO10 Expected date:  Expected time:  Means of arrival:  Comments: EMS 31 yo male with upper right chest pain 160/90

## 2017-09-14 NOTE — ED Provider Notes (Signed)
[facility]-EMERGENCY DEPT Provider Note   CSN: [id number] Arrival date & time: 09/14/17  0636     History   Chief Complaint Chief Complaint  Patient presents with  . Chest Pain    HPI Javonta Gronau is a 31 y.o. male.  31 year old male who presents with 24-hour history of sharp reproducible right-sided chest discomfort. No anginal type qualities. Patient is unsure that activity he was doing when the pain started.no associated dyspnea. He does work and Youth worker. Denies any associated fever, cough, congestion. No rashes appreciated. No treatment use prior to arrival.Denies any history of trauma.Symptoms better with remaining still a prior history of same.      Past Medical History:  Diagnosis Date  . Diabetes mellitus without complication (HCC)   . Hyperlipidemia   . Hypertension     There are no active problems to display for this patient.   Past Surgical History:  Procedure Laterality Date  . ABCESS DRAINAGE         Home Medications    Prior to Admission medications   Medication Sig Start Date End Date Taking? Authorizing Provider  benzonatate (TESSALON) 100 MG capsule Take 1 capsule (100 mg total) by mouth 3 (three) times daily as needed for cough. 02/17/16   Everlene Farrier, PA-C  cetirizine (ZYRTEC ALLERGY) 10 MG tablet Take 1 tablet (10 mg total) by mouth daily. 02/17/16   Everlene Farrier, PA-C  chlorhexidine (PERIDEX) 0.12 % solution Use as directed 15 mLs in the mouth or throat 2 (two) times daily. Patient not taking: Reported on 02/11/2015 03/07/14   Fayrene Helper, PA-C  Cholecalciferol (VITAMIN D3) 5000 units CAPS Take 5,000 Units by mouth daily.    [provider]  HYDROcodone-acetaminophen (NORCO) 5-325 MG per tablet Take 1-2 tablets by mouth every 4 (four) hours as needed for severe pain. 02/11/15   Loren Racer, MD  lansoprazole (PREVACID) 30 MG capsule Take 1 capsule (30 mg total) by mouth daily at 12 noon. 04/07/16   Jacalyn Lefevre, MD  naproxen  (NAPROSYN) 250 MG tablet Take 1 tablet (250 mg total) by mouth 2 (two) times daily with a meal. 02/17/16   Everlene Farrier, PA-C  ondansetron (ZOFRAN ODT) 4 MG disintegrating tablet  ODT q4 hours prn nausea/vomit 02/11/15   Loren Racer, MD    Family History No family history on file.  Social History Social History  Substance Use Topics  . Smoking status: Current Every Day Smoker  . Smokeless tobacco: Never Used  . Alcohol use No     Allergies   Patient has no known allergies.   Review of Systems Review of Systems  All other systems reviewed and are negative.    Physical Exam Updated Vital Signs BP (!) 148/94 (BP Location: Left Arm)   Pulse 66   Temp 98.2 F (36.8 C) (Oral)   Resp 18   SpO2 99%   Physical Exam  Constitutional: He is oriented to person, place, and time. He appears well-developed and well-nourished.  Non-toxic appearance. No distress.  HENT:  Head: Normocephalic and atraumatic.  Eyes: Pupils are equal, round, and reactive to light. Conjunctivae, EOM and lids are normal.  Neck: Normal range of motion. Neck supple. No tracheal deviation present. No thyroid mass present.  Cardiovascular: Normal rate, regular rhythm and normal heart sounds.  Exam reveals no gallop.   No murmur heard. Pulmonary/Chest: Effort normal and breath sounds normal. No stridor. No respiratory distress. He has no decreased breath sounds. He has no wheezes. He has  no rhonchi. He has no rales. He exhibits tenderness. He exhibits no crepitus.    Abdominal: Soft. Normal appearance and bowel sounds are normal. He exhibits no distension. There is no tenderness. There is no rebound and no CVA tenderness.  Musculoskeletal: Normal range of motion. He exhibits no edema or tenderness.  Neurological: He is alert and oriented to person, place, and time. He has normal strength. No cranial nerve deficit or sensory deficit. GCS eye subscore is 4. GCS verbal subscore is 5. GCS motor subscore is 6.    Skin: Skin is warm and dry. No abrasion and no rash noted.  Psychiatric: He has a normal mood and affect. His speech is normal and behavior is normal.  Nursing note and vitals reviewed.    ED Treatments / Results  Labs (all labs ordered are listed, but only abnormal results are displayed) Labs Reviewed  CBC  BASIC METABOLIC PANEL  I-STAT TROPONIN, ED    EKG  EKG Interpretation  Date/Time:  Friday September 14 2017 06:56:51 EDT Ventricular Rate:  72 PR Interval:    QRS Duration: 107 QT Interval:  379 QTC Calculation: 415 R Axis:   100 Text Interpretation:  Sinus rhythm Right axis deviation ST elev, probable normal early repol pattern No significant change since last tracing Confirmed by Lorre Nick ([id number]) on 09/14/2017 7:22:57 AM       Radiology Dg Chest 2 View  Result Date: 09/14/2017 CLINICAL DATA:  Chest pain for 2 days EXAM: CHEST  2 VIEW COMPARISON:  12/29/2016 FINDINGS: The heart size and mediastinal contours are within normal limits. Both lungs are clear. The visualized skeletal structures are unremarkable. IMPRESSION: No active cardiopulmonary disease. Electronically Signed   By: Alcide Clever M.D.   On: 09/14/2017 06:57    Procedures Procedures (including critical care time)  Medications Ordered in ED Medications  ketorolac (TORADOL) injection 60 mg (not administered)  methocarbamol (ROBAXIN) injection 1,000 mg (not administered)     Initial Impression / Assessment and Plan / ED Course  I have reviewed the triage vital signs and the nursing notes.  Pertinent labs & imaging results that were available during my care of the patient were reviewed by me and considered in my medical decision making (see chart for details).     Patient given Toradol and feels better. Suspect chest wall pain is etiology of his symptoms. We'll discharge home  Final Clinical Impressions(s) / ED Diagnoses   Final diagnoses:  None    New Prescriptions New Prescriptions    No medications on file     Lorre Nick, MD 09/14/17 0830

## 2017-09-14 NOTE — ED Notes (Signed)
Pt in xray

## 2017-09-14 NOTE — ED Triage Notes (Signed)
Patient arrives by EMS with complaints of right upper chest and right shoulder pain that started at 1800 yesterday evening.. Patient states pain worse with movement and palpation. Patient states cough because he has been smoking cigarettes. Denies any cardiac symptoms. Hypertension and on no meds. BP 160/90 HR 72.

## 2017-12-24 ENCOUNTER — Encounter (HOSPITAL_COMMUNITY): Payer: Self-pay

## 2017-12-24 ENCOUNTER — Other Ambulatory Visit: Payer: Self-pay

## 2017-12-24 ENCOUNTER — Emergency Department (HOSPITAL_COMMUNITY)
Admission: EM | Admit: 2017-12-24 | Discharge: 2017-12-24 | Disposition: A | Discharge status: Home / Self Care | Payer: Self-pay | Attending: Emergency Medicine | Admitting: Emergency Medicine

## 2017-12-24 DIAGNOSIS — E119 Type 2 diabetes mellitus without complications: Secondary | ICD-10-CM | POA: Insufficient documentation

## 2017-12-24 DIAGNOSIS — Z79899 Other long term (current) drug therapy: Secondary | ICD-10-CM | POA: Insufficient documentation

## 2017-12-24 DIAGNOSIS — I10 Essential (primary) hypertension: Secondary | ICD-10-CM | POA: Insufficient documentation

## 2017-12-24 DIAGNOSIS — A630 Anogenital (venereal) warts: Secondary | ICD-10-CM | POA: Insufficient documentation

## 2017-12-24 DIAGNOSIS — K625 Hemorrhage of anus and rectum: Secondary | ICD-10-CM

## 2017-12-24 LAB — I-STAT CHEM 8, ED
BUN: 12 mg/dL (ref 6–20)
CALCIUM ION: 1.16 mmol/L (ref 1.15–1.40)
CHLORIDE: 104 mmol/L (ref 101–111)
Creatinine, Ser: 1.1 mg/dL (ref 0.61–1.24)
Glucose, Bld: 98 mg/dL (ref 65–99)
HCT: 50 % (ref 39.0–52.0)
Hemoglobin: 17 g/dL (ref 13.0–17.0)
Potassium: 4 mmol/L (ref 3.5–5.1)
Sodium: 140 mmol/L (ref 135–145)
TCO2: 27 mmol/L (ref 22–32)

## 2017-12-24 LAB — POC OCCULT BLOOD, ED: Fecal Occult Bld: NEGATIVE

## 2017-12-24 MED ORDER — HYDROCORTISONE ACE-PRAMOXINE 1-1 % RE FOAM: 1.0000 | Freq: Two times a day (BID) | RECTAL | 1 refills | Status: DC

## 2017-12-24 MED ORDER — PODOFILOX 0.5 % EX GEL: Freq: Two times a day (BID) | CUTANEOUS | 0 refills | Status: DC

## 2017-12-24 NOTE — ED Notes (Signed)
EDP at bedside  

## 2017-12-24 NOTE — Discharge Instructions (Signed)
Please see the information and instructions below regarding your visit.  Your diagnoses today include:  1. Rectal bleeding   2. 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  These can be treated with podofilox application.  Sometimes we have to use a clinical grade, and you have to follow-up with general surgery for this.  Tests performed today include: See side panel of your discharge paperwork for testing performed today. Vital signs are listed at the bottom of these instructions.   Hemoglobin and hematocrit testing.  This was normal.   Syphilis and HIV testing.  If anything reacts, you will receive a call in 48-72 hours.  He will not receive a call for negative results.  Medications prescribed:    Take any prescribed medications only as prescribed, and any over the counter medications only as directed on the packaging.  Podofilox gel.  You will apply this gel with your finger to the warts twice a day for 3 days followed by 4 days of no therapy.  This can be repeated as necessary up to 4 times.  This medication cannot be used in pregnant women.  Proctofoam.  This is an anti-inflammatory agent for hemorrhoids.  Clean the rectum well before application.  Put foam until clean tissue and  apply externally.  Do not insert into rectum.  Home care instructions:  Please follow any educational materials contained in this packet.   See the instructions in this packet for sitz baths.  Sit and lukewarm water at the end of a long day to help keep the hemorrhoids lubricated.  Follow-up instructions: Please follow-up with your primary care provider in  for further evaluation of your symptoms if they are not completely improved.   Please follow up with gastroenterology and general surgery as needed for symptoms.  Return instructions:  Please return to the Emergency Department if you experience worsening symptoms.  Please return if you have any  other emergent concerns.  Additional Information:   Your vital signs today were: BP 134/83    Pulse 66    Temp 98.7 F (37.1 C) (Oral)    Resp 16    Ht 6\' 1"  (1.854 m)    Wt 131.5 kg (290 lb)    SpO2 99%    BMI 38.26 kg/m  If your blood pressure (BP) was elevated on multiple readings during this visit above 130 for the top number or above 80 for the bottom number, please have this repeated by your primary care provider within one month. --------------  Thank you for allowing us to participate in your care today.

## 2017-12-24 NOTE — ED Provider Notes (Signed)
[facility] EMERGENCY DEPARTMENT Provider Note   CSN: [id number] Arrival date & time: 12/24/17  1191     History   Chief Complaint Chief Complaint  Patient presents with  . Rectal Bleeding    HPI Kaevion Sinclair is a 32 y.o. male.  HPI  Patient is a 32 year old male with a history of diabetes mellitus, hyperlipidemia, and hypertension presenting for rectal bleeding with wiping times 2 months as well as "bumps" around his rectum for 2 months.  Patient reports that he has sex with only men, but has not had intercourse in 1 year.  Patient denies ever being diagnosed with STI.  Patient reports that he has daily bowel movements that are loose and preceded by abdominal cramping.  Patient denies any pain with his bowel movements.  Patient denies any blood mixed in the stool itself.  No black or tarry stools.  Patient denies fevers, significant weight loss, or night sweats.  Patient denies any dizziness, lightheadedness, palpitations, chest pain, or shortness of breath.  Patient has not tried any over-the-counter remedies for the symptoms.   Past Medical History:  Diagnosis Date  . Diabetes mellitus without complication (HCC)   . Hyperlipidemia   . Hypertension     There are no active problems to display for this patient.   Past Surgical History:  Procedure Laterality Date  . ABCESS DRAINAGE         Home Medications    Prior to Admission medications   Medication Sig Start Date End Date Taking? Authorizing Provider  hydrocortisone-pramoxine [facility]) rectal foam Place 1 applicator rectally 2 (two) times daily. 12/24/17   Aviva Kluver B, PA-C  ibuprofen (ADVIL,MOTRIN) 600 MG tablet Take 1 tablet (600 mg total) by mouth every 6 (six) hours as needed. 09/14/17   Lorre Nick, MD  methocarbamol (ROBAXIN) 500 MG tablet Take 1 tablet (500 mg total) by mouth 2 (two) times daily. 09/14/17   Lorre Nick, MD  podofilox (CONDYLOX) 0.5 % gel Apply topically 2  (two) times daily. 12/24/17   Elisha Ponder, PA-C    Family History No family history on file.  Social History Social History   Tobacco Use  . Smoking status: Current Every Day Smoker    Packs/day: 1.00    Types: Cigarettes  . Smokeless tobacco: Never Used  Substance Use Topics  . Alcohol use: No  . Drug use: No     Allergies   Patient has no known allergies.   Review of Systems Review of Systems  Constitutional: Negative for chills, fever and unexpected weight change.  Respiratory: Negative for shortness of breath.   Cardiovascular: Negative for chest pain.  Gastrointestinal: Positive for abdominal pain and anal bleeding. Negative for blood in stool.       + loose stool  Neurological: Negative for dizziness and light-headedness.  All other systems reviewed and are negative.    Physical Exam Updated Vital Signs BP 134/83   Pulse 66   Temp 98.7 F (37.1 C) (Oral)   Resp 16   Ht 6\' 1"  (1.854 m)   Wt 131.5 kg (290 lb)   SpO2 99%   BMI 38.26 kg/m   Physical Exam  Constitutional: He appears well-developed and well-nourished. No distress.  HENT:  Head: Normocephalic and atraumatic.  Mouth/Throat: Oropharynx is clear and moist.  Eyes: Conjunctivae and EOM are normal. Pupils are equal, round, and reactive to light.  Neck: Normal range of motion. Neck supple.  Cardiovascular: Normal rate, regular rhythm, S1  normal and S2 normal.  No murmur heard. Pulmonary/Chest: Effort normal and breath sounds normal. He has no wheezes. He has no rales.  Abdominal: Soft. He exhibits no distension. There is no tenderness. There is no guarding.  Genitourinary:  Genitourinary Comments: Exam performed with nurse chaperone, Asher MuirJamie, present.  There are 4 pedunculated, verrucous lesions surrounding the anal verge.  There is no active anal bleeding.  Normal rectal tone.  Small internal hemorrhoid noted at approximately 6 o'clock position.  Patient poorly tolerated procedure, and further  evaluation unable to be performed. Patient declined anoscope exam.  Musculoskeletal: Normal range of motion. He exhibits no edema or deformity.  Lymphadenopathy:    He has no cervical adenopathy.  Neurological: He is alert.  Cranial nerves grossly intact. Patient was extremities symmetrically and with good coordination.  Skin: Skin is warm and dry. No rash noted. No erythema.  Psychiatric: He has a normal mood and affect. His behavior is normal. Judgment and thought content normal.  Nursing note and vitals reviewed.    ED Treatments / Results  Labs (all labs ordered are listed, but only abnormal results are displayed) Labs Reviewed  RPR  HIV ANTIBODY (ROUTINE TESTING)  I-STAT CHEM 8, ED  POC OCCULT BLOOD, ED    EKG  EKG Interpretation None       Radiology No results found.  Procedures Procedures (including critical care time)  Medications Ordered in ED Medications - No data to display   Initial Impression / Assessment and Plan / ED Course  I have reviewed the triage vital signs and the nursing notes.  Pertinent labs & imaging results that were available during my care of the patient were reviewed by me and considered in my medical decision making (see chart for details).     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  Vital signs stable.  No evidence of tachycardia or hypotension.  No history of immunocompromise status.  Due to high risk sexual activity, will order HIV and RPR testing today.  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  No evidence of thrombosed external hemorrhoids.  Will treat with podofilox gel and foam.  Patient instructed that he will receive a call for any positive RPR or HIV results.  Patient instructed to follow-up with the health department.  Patient given  gastroenterology and general surgery follow-up should these lesions not resolve.  Return precautions for any bright red blood per rectum with clots, blood mixed with stool, dizziness, lightheadedness, chest pain, or shortness of breath suggestive of loss of blood.  Patient is understanding and agrees with plan of care.  ED Discharge Orders        Ordered    podofilox (CONDYLOX) 0.5 % gel  2 times daily     12/24/17 1047    hydrocortisone-pramoxine (PROCTOFOAM-HC) rectal foam  2 times daily     12/24/17 1047       Delia ChimesMurray, Brayli Klingbeil B, PA-C 12/24/17 1142    Mancel BaleWentz, Elliott, MD 12/24/17 1811

## 2017-12-24 NOTE — ED Notes (Signed)
ED Provider at bedside. 

## 2017-12-24 NOTE — ED Triage Notes (Signed)
Per Pt, Pt reports having some blood noted when he wipes for the last couple weeks. Reports some nodules noted to his rectum with some intermittent shooting pain to his rectum.

## 2017-12-25 LAB — RPR: RPR Ser Ql: NONREACTIVE

## 2017-12-25 LAB — HIV ANTIBODY (ROUTINE TESTING W REFLEX): HIV Screen 4th Generation wRfx: NONREACTIVE

## 2018-02-11 ENCOUNTER — Other Ambulatory Visit: Payer: Self-pay

## 2018-02-11 ENCOUNTER — Encounter (HOSPITAL_COMMUNITY): Payer: Self-pay

## 2018-02-11 ENCOUNTER — Emergency Department (HOSPITAL_COMMUNITY)
Admission: EM | Admit: 2018-02-11 | Discharge: 2018-02-11 | Disposition: A | Discharge status: Home / Self Care | Payer: Self-pay | Attending: Emergency Medicine | Admitting: Emergency Medicine

## 2018-02-11 DIAGNOSIS — F17210 Nicotine dependence, cigarettes, uncomplicated: Secondary | ICD-10-CM | POA: Insufficient documentation

## 2018-02-11 DIAGNOSIS — K625 Hemorrhage of anus and rectum: Secondary | ICD-10-CM | POA: Insufficient documentation

## 2018-02-11 DIAGNOSIS — E119 Type 2 diabetes mellitus without complications: Secondary | ICD-10-CM | POA: Insufficient documentation

## 2018-02-11 DIAGNOSIS — Z79899 Other long term (current) drug therapy: Secondary | ICD-10-CM | POA: Insufficient documentation

## 2018-02-11 DIAGNOSIS — A630 Anogenital (venereal) warts: Secondary | ICD-10-CM | POA: Insufficient documentation

## 2018-02-11 DIAGNOSIS — I10 Essential (primary) hypertension: Secondary | ICD-10-CM | POA: Insufficient documentation

## 2018-02-11 MED ORDER — PODOFILOX 0.5 % EX SOLN: Freq: Two times a day (BID) | CUTANEOUS | 0 refills | Status: DC

## 2018-02-11 NOTE — Discharge Instructions (Signed)
Apply topical gel twice daily.  You may also take Epsom salt baths for comfort. Alternate 600 mg of ibuprofen and [phone] mg of Tylenol every 4 hours as needed for pain. Do not exceed 4000 mg of Tylenol daily.  Follow-up with [facility] for reevaluation.  You may need to follow-up with a general surgeon for further evaluation if this medication does not work.  Return to the emergency department if any concerning signs or symptoms develop such as persistent abdominal pain, worsening pain/bleeding with bowel movements.  Fevers, or inability to tolerate food or fluids.

## 2018-02-11 NOTE — ED Provider Notes (Addendum)
[facility] EMERGENCY DEPARTMENT Provider Note   CSN: [id number] Arrival date & time: 02/11/18  1741     History   Chief Complaint Chief Complaint  Patient presents with  . Penile Discharge    HPI Michael Blankenship is a 32 y.o. male with history of DM, HLD, HTN presents with complaint of rectal bleeding with wiping for 4 months.  He was seen and evaluated in January for the same complaint and was diagnosed with condyloma acuminata and discharged with podofilox.  He states that he could not fill this medicine due to cost and was given a coupon for the wrong medication.  He notes pain occasionally which is sharp and radiates from the rectum up to his abdomen.  He denies any fevers, chills, nausea, vomiting, diarrhea, or constipation.  He has no pain with bowel movements.  He has no melena or hematochezia.  He only experiences this blood on wiping.  No aggravating or alleviating factors noted.  He has not tried anything for his symptoms.  He was not able to follow-up with [facility] due to long wait time.  He is not sexually active with men only but has not been sexually active for over 1 year.  HIV and RPR testing done at last visit were negative.  The history is provided by the patient.    Past Medical History:  Diagnosis Date  . Diabetes mellitus without complication (HCC)   . Hyperlipidemia   . Hypertension     There are no active problems to display for this patient.   Past Surgical History:  Procedure Laterality Date  . ABCESS DRAINAGE         Home Medications    Prior to Admission medications   Medication Sig Start Date End Date Taking? Authorizing Provider  hydrocortisone-pramoxine [facility]) rectal foam Place 1 applicator rectally 2 (two) times daily. 12/24/17   Aviva Kluver B, PA-C  ibuprofen (ADVIL,MOTRIN) 600 MG tablet Take 1 tablet (600 mg total) by mouth every 6 (six) hours as needed. 09/14/17   Lorre Nick, MD    methocarbamol (ROBAXIN) 500 MG tablet Take 1 tablet (500 mg total) by mouth 2 (two) times daily. 09/14/17   Lorre Nick, MD  podofilox (CONDYLOX) 0.5 % external solution Apply topically 2 (two) times daily. 02/11/18   Jeanie Sewer, PA-C    Family History History reviewed. No pertinent family history.  Social History Social History   Tobacco Use  . Smoking status: Current Every Day Smoker    Packs/day: 1.00    Types: Cigarettes  . Smokeless tobacco: Never Used  Substance Use Topics  . Alcohol use: Yes  . Drug use: No     Allergies   Patient has no known allergies.   Review of Systems Review of Systems  Constitutional: Negative for chills and fever.  Gastrointestinal: Positive for anal bleeding and rectal pain. Negative for abdominal distention, abdominal pain, blood in stool, constipation, diarrhea, nausea and vomiting.  Genitourinary: Negative for dysuria, hematuria, penile pain, penile swelling, scrotal swelling, testicular pain and urgency.     Physical Exam Updated Vital Signs BP 119/79 (BP Location: Right Arm)   Pulse 78   Temp (!) 97.5 F (36.4 C) (Oral)   Resp 20   Ht 6\' 1"  (1.854 m)   Wt 135.6 kg (299 lb)   SpO2 99%   BMI 39.45 kg/m   Physical Exam  Constitutional: He appears well-developed and well-nourished. No distress.  HENT:  Head: Normocephalic  and atraumatic.  Eyes: Conjunctivae are normal. Right eye exhibits no discharge. Left eye exhibits no discharge.  Neck: No JVD present. No tracheal deviation present.  Cardiovascular: Normal rate.  Pulmonary/Chest: Effort normal.  Abdominal: Soft. Bowel sounds are normal. He exhibits no distension. There is no tenderness. There is no guarding.  Genitourinary:  Genitourinary Comments: Examination performed in the presence of chaperone.  No frank rectal bleeding.  No external hemorrhoids.  4 small verrucous pedunculated lesions noted surrounding the anus.  Nontender to palpation.  No bleeding from these  lesions at this time.  No surrounding erythema or induration.  No fluctuance.  Good rectal tone.  Musculoskeletal: He exhibits no edema.  Neurological: He is alert.  Skin: Skin is warm and dry. No erythema.  Psychiatric: He has a normal mood and affect. His behavior is normal.  Nursing note and vitals reviewed.    ED Treatments / Results  Labs (all labs ordered are listed, but only abnormal results are displayed) Labs Reviewed - No data to display  EKG  EKG Interpretation None       Radiology No results found.  Procedures Procedures (including critical care time)  Medications Ordered in ED Medications - No data to display   Initial Impression / Assessment and Plan / ED Course  I have reviewed the triage vital signs and the nursing notes.  Pertinent labs & imaging results that were available during my care of the patient were reviewed by me and considered in my medical decision making (see chart for details).     Patient presentation consistent with condyloma acuminata.  Seen and evaluated for the same symptoms in January but was unable to follow-up with primary care or fill prescription due to cost.  Of note, patient's initial temperature was documented at 4190 F but this was likely entered in error as repeat temperature is 97.38F.  He is nontoxic and nonseptic in appearance and does not appear to be hypothermic.  Abdominal examination is entirely benign.  I doubt acute surgical abdominal pathology.  Presentation is not consistent with STI or UTI.  He was tested for HIV and syphilis the last time he was here and was found to be negative.  Denies any sexual activity since that time.  No frank rectal bleeding.  I doubt GI bleed.  No external hemorrhoids on examination.  No fissures or tears noted.  No signs of surrounding cellulitis or abscess.  Patient was given prescription and coupon for the medication he was given the last time he was here.  I encouraged patient to follow-up  with [place] [facility] wellness and informed him that they were referred from the emergency department.  Advised patient that he may still need to follow-up with general surgery or GI for further evaluation if symptoms do not improve.  Discussed indications for return to the ED. Pt verbalized understanding of and agreement with plan and is safe for discharge home at this time.   Final Clinical Impressions(s) / ED Diagnoses   Final diagnoses:  Rectal bleeding  Genital warts    ED Discharge Orders        Ordered    podofilox (CONDYLOX) 0.5 % external solution  2 times daily     02/11/18 1933       Jeanie SewerFawze, Eino Whitner A, PA-C 02/11/18 1937    Jeanie SewerFawze, Breeze Berringer A, PA-C 02/11/18 1938    Melene PlanFloyd, Dan, DO 02/11/18 [phone]

## 2018-02-11 NOTE — ED Triage Notes (Signed)
Pt endorses penile bleeding that began today. 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 Pt state that he was given a prescription but could not get prescription filled due to financial reasons. VSS.

## 2018-04-28 ENCOUNTER — Encounter (HOSPITAL_COMMUNITY): Payer: Self-pay | Admitting: Emergency Medicine

## 2018-04-28 ENCOUNTER — Emergency Department (HOSPITAL_COMMUNITY)
Admission: EM | Admit: 2018-04-28 | Discharge: 2018-04-28 | Disposition: A | Discharge status: Home / Self Care | Payer: No Typology Code available for payment source | Attending: Emergency Medicine | Admitting: Emergency Medicine

## 2018-04-28 DIAGNOSIS — S39012A Strain of muscle, fascia and tendon of lower back, initial encounter: Secondary | ICD-10-CM | POA: Insufficient documentation

## 2018-04-28 DIAGNOSIS — F17210 Nicotine dependence, cigarettes, uncomplicated: Secondary | ICD-10-CM | POA: Diagnosis not present

## 2018-04-28 DIAGNOSIS — E119 Type 2 diabetes mellitus without complications: Secondary | ICD-10-CM | POA: Insufficient documentation

## 2018-04-28 DIAGNOSIS — A630 Anogenital (venereal) warts: Secondary | ICD-10-CM | POA: Insufficient documentation

## 2018-04-28 DIAGNOSIS — Y929 Unspecified place or not applicable: Secondary | ICD-10-CM | POA: Insufficient documentation

## 2018-04-28 DIAGNOSIS — Y999 Unspecified external cause status: Secondary | ICD-10-CM | POA: Diagnosis not present

## 2018-04-28 DIAGNOSIS — I10 Essential (primary) hypertension: Secondary | ICD-10-CM | POA: Insufficient documentation

## 2018-04-28 DIAGNOSIS — Y939 Activity, unspecified: Secondary | ICD-10-CM | POA: Diagnosis not present

## 2018-04-28 DIAGNOSIS — S3992XA Unspecified injury of lower back, initial encounter: Secondary | ICD-10-CM | POA: Diagnosis present

## 2018-04-28 DIAGNOSIS — Z79899 Other long term (current) drug therapy: Secondary | ICD-10-CM | POA: Insufficient documentation

## 2018-04-28 MED ORDER — PODOFILOX 0.5 % EX SOLN: Freq: Two times a day (BID) | CUTANEOUS | 0 refills | Status: DC
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
## 2018-04-28 NOTE — ED Notes (Signed)
Patient eating and drinking in room.
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
## 2018-04-28 NOTE — Discharge Instructions (Signed)
Take Ibuprofen three times daily for the next week. Take this medicine with food. Use a heating pad for sore muscles - use for 20 minutes several times a day Try to establish care at a primary care clinic. A resource list has been provided Return for worsening symptoms

## 2018-06-11 ENCOUNTER — Emergency Department (HOSPITAL_COMMUNITY)
Admission: EM | Admit: 2018-06-11 | Discharge: 2018-06-11 | Disposition: A | Discharge status: Home / Self Care | Payer: Self-pay | Attending: Emergency Medicine | Admitting: Emergency Medicine

## 2018-06-11 ENCOUNTER — Other Ambulatory Visit: Payer: Self-pay

## 2018-06-11 DIAGNOSIS — E119 Type 2 diabetes mellitus without complications: Secondary | ICD-10-CM | POA: Insufficient documentation

## 2018-06-11 DIAGNOSIS — R51 Headache: Secondary | ICD-10-CM | POA: Insufficient documentation

## 2018-06-11 DIAGNOSIS — F4320 Adjustment disorder, unspecified: Secondary | ICD-10-CM

## 2018-06-11 DIAGNOSIS — F17210 Nicotine dependence, cigarettes, uncomplicated: Secondary | ICD-10-CM | POA: Insufficient documentation

## 2018-06-11 DIAGNOSIS — I10 Essential (primary) hypertension: Secondary | ICD-10-CM | POA: Insufficient documentation

## 2018-06-11 DIAGNOSIS — R519 Headache, unspecified: Secondary | ICD-10-CM

## 2018-06-11 DIAGNOSIS — Z79899 Other long term (current) drug therapy: Secondary | ICD-10-CM | POA: Insufficient documentation

## 2018-06-11 DIAGNOSIS — F4321 Adjustment disorder with depressed mood: Secondary | ICD-10-CM | POA: Insufficient documentation

## 2018-06-11 MED ORDER — LORAZEPAM 1 MG PO TABS
1.0000 mg | ORAL_TABLET | Freq: Once | ORAL | Status: DC
  Filled 2018-06-11: qty 1

## 2018-06-11 NOTE — ED Notes (Signed)
Patient verbalizes understanding of discharge instructions. Opportunity for questioning and answers were provided. Armband removed by staff, pt discharged from ED ambulatory.   

## 2018-06-11 NOTE — ED Provider Notes (Signed)
Patient placed in Quick Look pathway, seen and evaluated   Chief Complaint: headache  HPI:   Michael Blankenship is a 32 y.o. male who pBurt Ekresents to the ED with headache that started today. Patient reports that he got a phone call this morning that someone that was close to him had died and since then he has cried a lot and has a headache. Patient rates the pain as 9/10 and describes the headache as throbbing and starting at the temporal area and going across the forehead.   ROS: Neuro: headache  Psych: sad, depressed  Physical Exam:  BP 117/89   Pulse 74   Temp 99.3 F (37.4 C) (Oral)   Resp 18   Ht 6\' 1"  (1.854 m)   Wt 136.1 kg (300 lb)   SpO2 97%   BMI 39.58 kg/m    Gen: No distress  Neuro: Awake and Alert no distress, ambulatory with steady gait.  Skin: Warm and dry  Psych: depressed     Initiation of care has begun. The patient has been counseled on the process, plan, and necessity for staying for the completion/evaluation, and the remainder of the medical screening examination    Janne Napoleoneese, Fairley Copher M, NP 06/11/18 1932    Gerhard MunchLockwood, Robert, MD 06/11/18 2356

## 2018-06-11 NOTE — ED Notes (Signed)
ED Provider at bedside. 

## 2018-06-11 NOTE — Discharge Instructions (Addendum)
Please rest tonight No alcohol You may return to work tomorro

## 2018-06-11 NOTE — ED Triage Notes (Signed)
Patient states that he lost a friend close to him, and has been crying since 02:30a.m. States he has a HA that won't go away.

## 2018-06-23 NOTE — ED Provider Notes (Signed)
[facility] EMERGENCY DEPARTMENT Provider Note   CSN: [id number] Arrival date & time: 06/11/18  1918     History   Chief Complaint Chief Complaint  Patient presents with  . Headache    HPI Michael Blankenship is a 32 y.o. male.  HPI 32 year old male presents today complaining of headache.  He states that a good friend of his died last night and he has been crying since he found on the phone in the early morning hours.  He has diffuse headache.  He denies any visual changes, sore throat, nasal congestion, fever, chills, or neck stiffness.  He does sometimes get headaches.  He has not tried any medication. Past Medical History:  Diagnosis Date  . Diabetes mellitus without complication (HCC)   . Hyperlipidemia   . Hypertension     There are no active problems to display for this patient.   Past Surgical History:  Procedure Laterality Date  . ABCESS DRAINAGE          Home Medications    Prior to Admission medications   Medication Sig Start Date End Date Taking? Authorizing Provider  hydrocortisone-pramoxine [facility]) rectal foam Place 1 applicator rectally 2 (two) times daily. 12/24/17   Aviva Kluver B, PA-C  ibuprofen (ADVIL,MOTRIN) 600 MG tablet Take 1 tablet (600 mg total) by mouth every 6 (six) hours as needed. 09/14/17   Lorre Nick, MD  methocarbamol (ROBAXIN) 500 MG tablet Take 1 tablet (500 mg total) by mouth 2 (two) times daily. 09/14/17   Lorre Nick, MD  podofilox (CONDYLOX) 0.5 % external solution Apply topically 2 (two) times daily. 04/28/18   Bethel Born, PA-C    Family History No family history on file.  Social History Social History   Tobacco Use  . Smoking status: Current Every Day Smoker    Packs/day: 1.00    Types: Cigarettes  . Smokeless tobacco: Never Used  Substance Use Topics  . Alcohol use: Yes  . Drug use: No     Allergies   Patient has no known allergies.   Review of Systems Review of Systems    All other systems reviewed and are negative.    Physical Exam Updated Vital Signs BP 117/89   Pulse 74   Temp 99.3 F (37.4 C) (Oral)   Resp 18   Ht 1.854 m (6\' 1" )   Wt 136.1 kg (300 lb)   SpO2 97%   BMI 39.58 kg/m   Physical Exam  Constitutional: He is oriented to person, place, and time. He appears well-developed.  HENT:  Head: Normocephalic and atraumatic.  Right Ear: External ear normal.  Left Ear: External ear normal.  Nose: Nose normal.  Eyes: EOM are normal.  Neck: No tracheal deviation present.  Pulmonary/Chest: Effort normal.  Musculoskeletal: Normal range of motion.  Neurological: He is alert and oriented to person, place, and time. He has normal strength. He displays normal reflexes. He displays a negative Romberg sign. Coordination and gait normal.  Skin: Skin is warm and dry.  Psychiatric: He has a normal mood and affect. His behavior is normal.  Nursing note and vitals reviewed.    ED Treatments / Results  Labs (all labs ordered are listed, but only abnormal results are displayed) Labs Reviewed - No data to display  EKG None  Radiology No results found.  Procedures Procedures (including critical care time)  Medications Ordered in ED Medications - No data to display   Initial Impression / Assessment and Plan /  ED Course  I have reviewed the triage vital signs and the nursing notes.  Pertinent labs & imaging results that were available during my care of the patient were reviewed by me and considered in my medical decision making (see chart for details).     Patient with headache here.  Doubt emergent cause of headache such as bleed, meningitis.  Patient's symptoms came on when he found out about the death of her friend.  Has a normal neurological exam here.  Patient given 1 mg of Ativan and has a ride home.  We have discussed his grief reaction, need for follow-up, and return precautions and voices understanding. Final Clinical  Impressions(s) / ED Diagnoses   Final diagnoses:  Acute nonintractable headache, unspecified headache type  Grief reaction    ED Discharge Orders    None       Margarita Grizzleay, Jontavius Rabalais, MD 06/23/18 [id number]

## 2018-07-02 ENCOUNTER — Emergency Department (HOSPITAL_COMMUNITY)
Admission: EM | Admit: 2018-07-02 | Discharge: 2018-07-02 | Disposition: A | Discharge status: Home / Self Care | Payer: Self-pay | Attending: Emergency Medicine | Admitting: Emergency Medicine

## 2018-07-02 ENCOUNTER — Encounter (HOSPITAL_COMMUNITY): Payer: Self-pay

## 2018-07-02 ENCOUNTER — Other Ambulatory Visit: Payer: Self-pay

## 2018-07-02 DIAGNOSIS — E119 Type 2 diabetes mellitus without complications: Secondary | ICD-10-CM | POA: Insufficient documentation

## 2018-07-02 DIAGNOSIS — R21 Rash and other nonspecific skin eruption: Secondary | ICD-10-CM | POA: Insufficient documentation

## 2018-07-02 DIAGNOSIS — Z760 Encounter for issue of repeat prescription: Secondary | ICD-10-CM | POA: Insufficient documentation

## 2018-07-02 DIAGNOSIS — Z79899 Other long term (current) drug therapy: Secondary | ICD-10-CM | POA: Insufficient documentation

## 2018-07-02 DIAGNOSIS — I10 Essential (primary) hypertension: Secondary | ICD-10-CM | POA: Insufficient documentation

## 2018-07-02 DIAGNOSIS — F17210 Nicotine dependence, cigarettes, uncomplicated: Secondary | ICD-10-CM | POA: Insufficient documentation

## 2018-07-02 HISTORY — DX: Papillomavirus as the cause of diseases classified elsewhere: B97.7

## 2018-07-02 MED ORDER — PODOFILOX 0.5 % EX SOLN: Freq: Two times a day (BID) | CUTANEOUS | 0 refills | Status: DC

## 2018-07-02 NOTE — Discharge Instructions (Addendum)
Follow up with primary care provider as discussed.

## 2018-07-02 NOTE — ED Triage Notes (Signed)
Needs refill on Podofilox (Condylox) 0.5 % external solution, apply topically 2 times day.

## 2018-07-02 NOTE — ED Provider Notes (Signed)
[facility] EMERGENCY DEPARTMENT Provider Note   CSN: [id number] Arrival date & time: 07/02/18  1603     History   Chief Complaint Chief Complaint  Patient presents with  . Medication Refill    HPI Cotton Beckley is a 32 y.o. male.  32 year old male presents with request for refill of his genital wart medication.  Patient states that he is unable to pick up the last prescription because it is expired.  Patient reports anal warts for the past year, diagnosed in this emergency room, takes his medication applies this medication as prescribed.  No other complaints or concerns today.     Past Medical History:  Diagnosis Date  . Diabetes mellitus without complication (HCC)   . 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 Hyperlipidemia   . Hypertension     There are no active problems to display for this patient.   Past Surgical History:  Procedure Laterality Date  . ABCESS DRAINAGE          Home Medications    Prior to Admission medications   Medication Sig Start Date End Date Taking? Authorizing Provider  hydrocortisone-pramoxine [facility]) rectal foam Place 1 applicator rectally 2 (two) times daily. 12/24/17   Aviva Kluver B, PA-C  ibuprofen (ADVIL,MOTRIN) 600 MG tablet Take 1 tablet (600 mg total) by mouth every 6 (six) hours as needed. 09/14/17   Lorre Nick, MD  methocarbamol (ROBAXIN) 500 MG tablet Take 1 tablet (500 mg total) by mouth 2 (two) times daily. 09/14/17   Lorre Nick, MD  podofilox (CONDYLOX) 0.5 % external solution Apply topically 2 (two) times daily. 07/02/18   Jeannie Fend, PA-C    Family History History reviewed. No pertinent family history.  Social History Social History   Tobacco Use  . Smoking status: Current Every Day Smoker    Packs/day: 1.00    Types: Cigarettes  . Smokeless tobacco: Never Used  Substance Use Topics  . Alcohol use: Yes  . Drug use: No     Allergies   Patient has no known allergies.   Review of  Systems Review of Systems  Constitutional: Negative for fever.  Gastrointestinal: Negative for abdominal pain.  Musculoskeletal: Negative for arthralgias and myalgias.  Skin: Positive for rash. Negative for wound.  Allergic/Immunologic: Positive for immunocompromised state.  All other systems reviewed and are negative.    Physical Exam Updated Vital Signs BP 126/80   Pulse 68   Temp 98.5 F (36.9 C) (Oral)   Resp 18   SpO2 100%   Physical Exam  Constitutional: He is oriented to person, place, and time. He appears well-developed and well-nourished. No distress.  HENT:  Head: Normocephalic and atraumatic.  Pulmonary/Chest: Effort normal.  Neurological: He is alert and oriented to person, place, and time.  Skin: He is not diaphoretic.  Psychiatric: He has a normal mood and affect. His behavior is normal.  Nursing note and vitals reviewed.    ED Treatments / Results  Labs (all labs ordered are listed, but only abnormal results are displayed) Labs Reviewed - No data to display  EKG None  Radiology No results found.  Procedures Procedures (including critical care time)  Medications Ordered in ED Medications - No data to display   Initial Impression / Assessment and Plan / ED Course  I have reviewed the triage vital signs and the nursing notes.  Pertinent labs & imaging results that were available during my care of the patient were reviewed by me and considered  in my medical decision making (see chart for details).      Final Clinical Impressions(s) / ED Diagnoses   Final diagnoses:  Medication refill    ED Discharge Orders        Ordered    podofilox (CONDYLOX) 0.5 % external solution  2 times daily     07/02/18 1629       Jeannie FendMurphy, Mikinzie Maciejewski A, PA-C 07/02/18 1635    Derwood KaplanNanavati, Ankit, MD 07/03/18 [phone]

## 2018-10-09 ENCOUNTER — Emergency Department (HOSPITAL_COMMUNITY)
Admission: EM | Admit: 2018-10-09 | Discharge: 2018-10-10 | Disposition: A | Discharge status: Home / Self Care | Payer: Self-pay | Attending: Emergency Medicine | Admitting: Emergency Medicine

## 2018-10-09 ENCOUNTER — Encounter (HOSPITAL_COMMUNITY): Payer: Self-pay | Admitting: Emergency Medicine

## 2018-10-09 DIAGNOSIS — K6289 Other specified diseases of anus and rectum: Secondary | ICD-10-CM | POA: Insufficient documentation

## 2018-10-09 DIAGNOSIS — E119 Type 2 diabetes mellitus without complications: Secondary | ICD-10-CM | POA: Insufficient documentation

## 2018-10-09 DIAGNOSIS — Z79899 Other long term (current) drug therapy: Secondary | ICD-10-CM | POA: Insufficient documentation

## 2018-10-09 DIAGNOSIS — I10 Essential (primary) hypertension: Secondary | ICD-10-CM | POA: Insufficient documentation

## 2018-10-09 DIAGNOSIS — F17210 Nicotine dependence, cigarettes, uncomplicated: Secondary | ICD-10-CM | POA: Insufficient documentation

## 2018-10-09 DIAGNOSIS — A630 Anogenital (venereal) warts: Secondary | ICD-10-CM | POA: Insufficient documentation

## 2018-10-09 NOTE — ED Provider Notes (Signed)
[facility] EMERGENCY DEPARTMENT Provider Note   CSN: [id number] Arrival date & time: 10/09/18  2023     History   Chief Complaint Chief Complaint  Patient presents with  . Genital Warts  . Penile Pain  . Rectal Pain    HPI Michael Blankenship is a 32 y.o. male with h/o HPV and anal warts is here for evaluation of rectal pain sudden onset this morning. Associated with burning and itching. He noticed a big "knot" to area around anus that looks different than regular warts he has.  Reports long history of recurring warts to anus, multiple.  Has been seen in ER for same and has used solution which helps but warts return 2-3 months after he stops using the solution.  Pain is constant severe but worse after BM and with palpation.  No interventions PTA. No rectal bleeding or drainage, abdominal pain, penile discharge, dysuria, hematuria. No other genital or oral lesions. He has not been sexually active in a long time. He was seen in ER January 2019 for anal warts and tested for HIV and RPR which were negative. States he has not been sexually active since that date.  He has sex with men.   HPI  Past Medical History:  Diagnosis Date  . Diabetes mellitus without complication (HCC)   . 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 Hyperlipidemia   . Hypertension     There are no active problems to display for this patient.   Past Surgical History:  Procedure Laterality Date  . ABCESS DRAINAGE          Home Medications    Prior to Admission medications   Medication Sig Start Date End Date Taking? Authorizing Provider  amoxicillin-clavulanate (AUGMENTIN) 875-125 MG tablet Take 1 tablet by mouth every 12 (twelve) hours. 10/10/18   Liberty Handy, PA-C  benzocaine (AMERICAINE) 20 % rectal ointment Place rectally every 3 (three) hours as needed for pain. 10/10/18   Liberty Handy, PA-C  hydrocortisone-pramoxine [facility]) rectal foam Place 1 applicator rectally 2 (two) times daily.  12/24/17   Aviva Kluver B, PA-C  ibuprofen (ADVIL,MOTRIN) 600 MG tablet Take 1 tablet (600 mg total) by mouth every 6 (six) hours as needed. 09/14/17   Lorre Nick, MD  methocarbamol (ROBAXIN) 500 MG tablet Take 1 tablet (500 mg total) by mouth 2 (two) times daily. 09/14/17   Lorre Nick, MD  podofilox (CONDYLOX) 0.5 % external solution Apply topically 2 (two) times daily. 10/10/18   Liberty Handy, PA-C  traMADol (ULTRAM) 50 MG tablet Take 1 tablet (50 mg total) by mouth every 6 (six) hours as needed. 10/10/18   Liberty Handy, PA-C    Family History History reviewed. No pertinent family history.  Social History Social History   Tobacco Use  . Smoking status: Current Every Day Smoker    Packs/day: 1.00    Types: Cigarettes  . Smokeless tobacco: Never Used  Substance Use Topics  . Alcohol use: Yes  . Drug use: No     Allergies   Patient has no known allergies.   Review of Systems Review of Systems  Genitourinary:       Rectal pain and itching and warts   All other systems reviewed and are negative.    Physical Exam Updated Vital Signs BP (!) 142/93 (BP Location: Right Arm)   Pulse 99   Temp 98.1 F (36.7 C) (Oral)   Resp 18   Ht 6\' 1"  (1.854 m)  SpO2 100%   BMI 39.58 kg/m   Physical Exam  Constitutional: He is oriented to person, place, and time. He appears well-developed and well-nourished.  Non-toxic appearance.  HENT:  Head: Normocephalic.  Right Ear: External ear normal.  Left Ear: External ear normal.  Nose: Nose normal.  Eyes: Conjunctivae and EOM are normal.  Neck: Full passive range of motion without pain.  Cardiovascular: Normal rate.  Pulmonary/Chest: Effort normal. No tachypnea. No respiratory distress.  Genitourinary:  Genitourinary Comments: approx 10-12 verrucous pedunculated growths around anus.  Single, exquisitely tender, erythematous, mildly indurated bulge approx size of a dime, to left of opening of anus.  Patient cannot  tolerate internal DRE. Exam performed with EMT at bedside.   Musculoskeletal: Normal range of motion.  Neurological: He is alert and oriented to person, place, and time.  Skin: Skin is warm and dry. Capillary refill takes less than 2 seconds.  Psychiatric: His behavior is normal. Thought content normal.     ED Treatments / Results  Labs (all labs ordered are listed, but only abnormal results are displayed) Labs Reviewed - No data to display  EKG None  Radiology No results found.  Procedures Procedures (including critical care time)  Medications Ordered in ED Medications - No data to display   Initial Impression / Assessment and Plan / ED Course  I have reviewed the triage vital signs and the nursing notes.  Pertinent labs & imaging results that were available during my care of the patient were reviewed by me and considered in my medical decision making (see chart for details).    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  o further testing for perianal wart, consistent with HPV. Tested for HIV and RPR this year, negative, he has not been sexually active since testing. Will refill condylox solution.  Referral to general surgery who may evaluate for cryo/excision if indicated.   Tender, erythematous, slightly firm/fluctuance bulge could be perianal abscess vs thrombosed/inflammed external hemorrhoid.  Favoring small perianal abscess.  Given location, I recommended needle aspiration. Pt refused multiple times.  Pt was examined by Dr Rush Landmark and again refused intervention. He does not have expanding cellulitis or spread of fluctuance, it is very focal.  No fevers, chills, rectal bleeding.  Non toxic. Do not think emergent labs indicated, I suspect WBC and lactic acid would be normal given symptom onset this morning.  Will dc with augmentin, sitz baths, tramadol, high dose NSAIDs, benzocaine rectal gel.  Recommended f/u  with general surgery who could perform I&D if needed.  This is less likely to be an external hemorrhoids, however general surgery could also address this.  Had long discussion with pt regarding signs and symptoms that would warrant return to ER. He verbalized understanding.   Final Clinical Impressions(s) / ED Diagnoses   Final diagnoses:  Rectal pain  Anal warts    ED Discharge Orders         Ordered    amoxicillin-clavulanate (AUGMENTIN) 875-125 MG tablet  Every 12 hours     10/10/18 0017    benzocaine (AMERICAINE) 20 % rectal ointment  Every  3 hours PRN     10/10/18 0017    traMADol (ULTRAM) 50 MG tablet  Every 6 hours PRN     10/10/18 0017    podofilox (CONDYLOX) 0.5 % external solution  2 times daily,   Status:  Discontinued     10/10/18 0017  podofilox (CONDYLOX) 0.5 % external solution  2 times daily     10/10/18 0046           Liberty Handy, PA-C 10/10/18 0116    Tegeler, Canary Brim, MD 10/10/18 [id number]

## 2018-10-09 NOTE — ED Triage Notes (Signed)
Pt presents with hx of genital warts, now having a "flare up"; they itch; cant afford meds because they are expensive; denies painful urination, no discharge

## 2018-10-10 MED ORDER — TRAMADOL HCL 50 MG PO TABS: 50.0000 mg | ORAL_TABLET | Freq: Four times a day (QID) | ORAL | 0 refills | Status: DC | PRN

## 2018-10-10 MED ORDER — PODOFILOX 0.5 % EX SOLN: Freq: Two times a day (BID) | CUTANEOUS | 0 refills | Status: DC

## 2018-10-10 MED ORDER — BENZOCAINE 20 % RE OINT: TOPICAL_OINTMENT | RECTAL | 0 refills | Status: DC | PRN

## 2018-10-10 MED ORDER — AMOXICILLIN-POT CLAVULANATE 875-125 MG PO TABS: 1.0000 | ORAL_TABLET | Freq: Two times a day (BID) | ORAL | 0 refills | Status: DC

## 2018-10-10 NOTE — Discharge Instructions (Signed)
You were seen in the ER for rectal pain.   Use condylox solution for warts.   The pain and swelling is most likely from an abscess. You declined drainage. Take antibiotics. Use benzocaine numbing cream before bowel movements and every 6 hours.  Tramadol for pain. Additionally, take 600 mg ibuprofen plus 1000 mg acetaminophen every 6 hours. Sitz warm baths.   Return for fever, chills, worsening swelling, pus, bleeding

## 2018-11-12 ENCOUNTER — Encounter (HOSPITAL_COMMUNITY): Payer: Self-pay

## 2018-11-12 ENCOUNTER — Other Ambulatory Visit: Payer: Self-pay

## 2018-11-12 ENCOUNTER — Emergency Department (HOSPITAL_COMMUNITY): Payer: Self-pay

## 2018-11-12 ENCOUNTER — Emergency Department (HOSPITAL_COMMUNITY)
Admission: EM | Admit: 2018-11-12 | Discharge: 2018-11-12 | Disposition: A | Discharge status: Home / Self Care | Payer: Self-pay | Attending: Emergency Medicine | Admitting: Emergency Medicine

## 2018-11-12 DIAGNOSIS — Y939 Activity, unspecified: Secondary | ICD-10-CM | POA: Insufficient documentation

## 2018-11-12 DIAGNOSIS — M5441 Lumbago with sciatica, right side: Secondary | ICD-10-CM | POA: Insufficient documentation

## 2018-11-12 DIAGNOSIS — Y999 Unspecified external cause status: Secondary | ICD-10-CM | POA: Insufficient documentation

## 2018-11-12 DIAGNOSIS — I10 Essential (primary) hypertension: Secondary | ICD-10-CM | POA: Insufficient documentation

## 2018-11-12 DIAGNOSIS — W19XXXA Unspecified fall, initial encounter: Secondary | ICD-10-CM

## 2018-11-12 DIAGNOSIS — W1789XA Other fall from one level to another, initial encounter: Secondary | ICD-10-CM | POA: Insufficient documentation

## 2018-11-12 DIAGNOSIS — F17210 Nicotine dependence, cigarettes, uncomplicated: Secondary | ICD-10-CM | POA: Insufficient documentation

## 2018-11-12 DIAGNOSIS — E119 Type 2 diabetes mellitus without complications: Secondary | ICD-10-CM | POA: Insufficient documentation

## 2018-11-12 DIAGNOSIS — M5126 Other intervertebral disc displacement, lumbar region: Secondary | ICD-10-CM | POA: Insufficient documentation

## 2018-11-12 DIAGNOSIS — Y929 Unspecified place or not applicable: Secondary | ICD-10-CM | POA: Insufficient documentation

## 2018-11-12 LAB — COMPREHENSIVE METABOLIC PANEL
ALBUMIN: 3.8 g/dL (ref 3.5–5.0)
ALK PHOS: 78 U/L (ref 38–126)
ALT: 51 U/L — ABNORMAL HIGH (ref 0–44)
ANION GAP: 5 (ref 5–15)
AST: 32 U/L (ref 15–41)
BUN: 10 mg/dL (ref 6–20)
CALCIUM: 9.2 mg/dL (ref 8.9–10.3)
CO2: 27 mmol/L (ref 22–32)
Chloride: 105 mmol/L (ref 98–111)
Creatinine, Ser: 1.14 mg/dL (ref 0.61–1.24)
GFR calc Af Amer: >60 mL/min (ref >60)
GFR calc non Af Amer: >60 mL/min (ref >60)
GLUCOSE: 84 mg/dL (ref 70–99)
POTASSIUM: 4.2 mmol/L (ref 3.5–5.1)
SODIUM: 137 mmol/L (ref 135–145)
Total Bilirubin: 0.6 mg/dL (ref 0.3–1.2)
Total Protein: 7.1 g/dL (ref 6.5–8.1)

## 2018-11-12 LAB — CBC
HCT: 51.1 % (ref 39.0–52.0)
Hemoglobin: 16.4 g/dL (ref 13.0–17.0)
MCH: 29.1 pg (ref 26.0–34.0)
MCHC: 32.1 g/dL (ref 30.0–36.0)
MCV: 90.8 fL (ref 80.0–100.0)
Platelets: 230 10*3/uL (ref 150–400)
RBC: 5.63 MIL/uL (ref 4.22–5.81)
RDW: 13.9 % (ref 11.5–15.5)
WBC: 7 10*3/uL (ref 4.0–10.5)
nRBC: 0 % (ref 0.0–0.2)

## 2018-11-12 MED ORDER — PREDNISONE 10 MG PO TABS: ORAL_TABLET | ORAL | 0 refills | Status: DC

## 2018-11-12 MED ORDER — NAPROXEN 250 MG PO TABS
500.0000 mg | ORAL_TABLET | Freq: Once | ORAL | Status: AC
  Administered 2018-11-12: 500 mg via ORAL
  Filled 2018-11-12: qty 2

## 2018-11-12 MED ORDER — PREDNISONE 20 MG PO TABS
60.0000 mg | ORAL_TABLET | Freq: Once | ORAL | Status: AC
  Administered 2018-11-12: 60 mg via ORAL
  Filled 2018-11-12: qty 3

## 2018-11-12 MED ORDER — OXYCODONE HCL 5 MG PO TABS
5.0000 mg | ORAL_TABLET | Freq: Once | ORAL | Status: AC
  Administered 2018-11-12: 5 mg via ORAL
  Filled 2018-11-12: qty 1

## 2018-11-12 MED ORDER — OXYCODONE HCL 5 MG PO TABS
5.0000 mg | ORAL_TABLET | Freq: Once | ORAL | Status: DC
  Filled 2018-11-12: qty 1

## 2018-11-12 NOTE — ED Provider Notes (Signed)
[facility] Emergency Department Provider Note MRN:  [id number]  Arrival date & time: 11/12/18     Chief Complaint   Fall   History of Present Illness   Michael Blankenship is a 32 y.o. year-old male with a history of diabetes presenting to the ED with chief complaint of fall.  1 week ago, patient was standing from a seated position and felt severe sudden pain to his lower right back.  This caused him to fall forward onto his knees.  No head trauma, no loss of consciousness.  Severe pain caused him to have to crawl to get help.  Pain has continued, with radiation down the back of the leg, wrapping around to the dorsal surface of the foot.  For the past few days now experiencing decreased sensation and numbness to the foot, as well as decreased strength to the right leg.  Denies bowel or bladder dysfunction.  No recent fever.  Review of Systems  A complete 10 system review of systems was obtained and all systems are negative except as noted in the HPI and PMH.   Patient's Health History    Past Medical History:  Diagnosis Date  . Diabetes mellitus without complication (HCC)   . 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 Hyperlipidemia   . Hypertension     Past Surgical History:  Procedure Laterality Date  . ABCESS DRAINAGE      History reviewed. No pertinent family history.  Social History   Socioeconomic History  . Marital status: Single    Spouse name: Not on file  . Number of children: Not on file  . Years of education: Not on file  . Highest education level: Not on file  Occupational History  . Not on file  Social Needs  . Financial resource strain: Not on file  . Food insecurity:    Worry: Not on file    Inability: Not on file  . Transportation needs:    Medical: Not on file    Non-medical: Not on file  Tobacco Use  . Smoking status: Current Every Day Smoker    Packs/day: 1.00    Types: Cigarettes  . Smokeless tobacco: Never Used  Substance and Sexual Activity  . Alcohol  use: Yes  . Drug use: No  . Sexual activity: Not on file  Lifestyle  . Physical activity:    Days per week: Not on file    Minutes per session: Not on file  . Stress: Not on file  Relationships  . Social connections:    Talks on phone: Not on file    Gets together: Not on file    Attends religious service: Not on file    Active member of club or organization: Not on file    Attends meetings of clubs or organizations: Not on file    Relationship status: Not on file  . Intimate partner violence:    Fear of current or ex partner: Not on file    Emotionally abused: Not on file    Physically abused: Not on file    Forced sexual activity: Not on file  Other Topics Concern  . Not on file  Social History Narrative  . Not on file     Physical Exam  Vital Signs and Nursing Notes reviewed Vitals:   11/12/18 1427  BP: (!) 143/101  Pulse: 69  Resp: 18  Temp: 98.5 F (36.9 C)  SpO2: 98%    CONSTITUTIONAL: Well-appearing, NAD NEURO:  Alert and  oriented x 3, no focal deficits; decreased sensation to the plantar surface of right foot as well as the right distal tibia EYES:  eyes equal and reactive ENT/NECK:  no LAD, no JVD CARDIO: Regular rate, well-perfused, normal S1 and S2 PULM:  CTAB no wheezing or rhonchi GI/GU:  normal bowel sounds, non-distended, non-tender MSK/SPINE:  No gross deformities, no edema SKIN:  no rash, atraumatic PSYCH:  Appropriate speech and behavior  Diagnostic and Interventional Summary    EKG Interpretation  Date/Time:    Ventricular Rate:    PR Interval:    QRS Duration:   QT Interval:    QTC Calculation:   R Axis:     Text Interpretation:        Labs Reviewed  COMPREHENSIVE METABOLIC PANEL - Abnormal; Notable for the following components:      Result Value   ALT 51 (*)    All other components within normal limits  CBC    MR LUMBAR SPINE WO CONTRAST  Final Result      Medications  oxyCODONE (Oxy IR/ROXICODONE) immediate release  tablet 5 mg (5 mg Oral Not Given 11/12/18 1804)  oxyCODONE (Oxy IR/ROXICODONE) immediate release tablet 5 mg (5 mg Oral Given 11/12/18 1445)  naproxen (NAPROSYN) tablet 500 mg (500 mg Oral Given 11/12/18 1804)  predniSONE (DELTASONE) tablet 60 mg (60 mg Oral Given 11/12/18 1835)     Procedures Critical Care  ED Course and Medical Decision Making  I have reviewed the triage vital signs and the nursing notes.  Pertinent labs & imaging results that were available during my care of the patient were reviewed by me and considered in my medical decision making (see below for details).  Concern for radicular back pain with progressive neurological deficit, will need MRI to exclude myelopathy.  Labs unremarkable.  MRI reveals disc herniation with moderate canal stenosis.  Discussed findings with Dr. Venetia MaxonStern of neurosurgery, patient appropriate for outpatient follow-up and steroid taper.  After the discussed management above, the patient was determined to be safe for discharge.  The patient was in agreement with this plan and all questions regarding their care were answered.  ED return precautions were discussed and the patient will return to the ED with any significant worsening of condition.  Elmer SowMichael M. Pilar PlateBero, MD [facility] [email or web address]  Final Clinical Impressions(s) / ED Diagnoses     ICD-10-CM   1. Fall, initial encounter W19.XXXA   2. Acute right-sided low back pain with right-sided sciatica M54.41   3. 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

## 2018-11-12 NOTE — Discharge Instructions (Signed)
You were evaluated in the Emergency Department and after careful evaluation, we did not find any emergent condition requiring admission or further testing in the hospital.  Your symptoms today seem to be due to a herniated disc of your lower back.  Please take the steroid taper as directed and follow-up with Dr. Venetia MaxonStern of neurosurgery.  Please return to the Emergency Department if you experience any worsening of your condition.  We encourage you to follow up with a primary care provider.  Thank you for allowing us to be a part of your care.

## 2018-11-12 NOTE — ED Triage Notes (Signed)
Pt here after a fall with back pain. He reports numbness and tingling down his right leg.

## 2018-11-12 NOTE — ED Notes (Signed)
Pt taken to MRI  

## 2018-11-12 NOTE — ED Notes (Signed)
Esignature pad not working. Pt agreeable to discharge; received discharge instructions and prescriptions.

## 2018-11-12 NOTE — ED Notes (Signed)
Moved hallway beds per pt request to be near an outlet to charge his phone

## 2019-02-06 ENCOUNTER — Encounter (HOSPITAL_COMMUNITY): Payer: Self-pay | Admitting: Emergency Medicine

## 2019-02-06 ENCOUNTER — Emergency Department (HOSPITAL_COMMUNITY)
Admission: EM | Admit: 2019-02-06 | Discharge: 2019-02-06 | Disposition: A | Discharge status: Home / Self Care | Payer: BLUE CROSS/BLUE SHIELD | Attending: Emergency Medicine | Admitting: Emergency Medicine

## 2019-02-06 DIAGNOSIS — E119 Type 2 diabetes mellitus without complications: Secondary | ICD-10-CM | POA: Insufficient documentation

## 2019-02-06 DIAGNOSIS — F17210 Nicotine dependence, cigarettes, uncomplicated: Secondary | ICD-10-CM | POA: Diagnosis not present

## 2019-02-06 DIAGNOSIS — M5432 Sciatica, left side: Secondary | ICD-10-CM | POA: Diagnosis not present

## 2019-02-06 DIAGNOSIS — I10 Essential (primary) hypertension: Secondary | ICD-10-CM | POA: Diagnosis not present

## 2019-02-06 DIAGNOSIS — M545 Low back pain: Secondary | ICD-10-CM | POA: Diagnosis present

## 2019-02-06 MED ORDER — KETOROLAC TROMETHAMINE 15 MG/ML IJ SOLN
15.0000 mg | Freq: Once | INTRAMUSCULAR | Status: AC
  Administered 2019-02-06: 15 mg via INTRAVENOUS
  Filled 2019-02-06: qty 1

## 2019-02-06 MED ORDER — OXYCODONE HCL 5 MG PO TABS
5.0000 mg | ORAL_TABLET | Freq: Once | ORAL | Status: AC
  Administered 2019-02-06: 5 mg via ORAL
  Filled 2019-02-06: qty 1

## 2019-02-06 MED ORDER — DIAZEPAM 5 MG PO TABS
5.0000 mg | ORAL_TABLET | Freq: Once | ORAL | Status: AC
  Administered 2019-02-06: 5 mg via ORAL
  Filled 2019-02-06: qty 1

## 2019-02-06 MED ORDER — ACETAMINOPHEN 500 MG PO TABS
1000.0000 mg | ORAL_TABLET | Freq: Once | ORAL | Status: AC
  Administered 2019-02-06: 1000 mg via ORAL
  Filled 2019-02-06: qty 2

## 2019-02-06 NOTE — ED Notes (Signed)
Patient verbalizes understanding of medications and discharge instructions. No further questions at this time. VSS and patient ambulatory at discharge.   

## 2019-02-06 NOTE — ED Triage Notes (Addendum)
Patient arrived via [facility] from home with reports of sudden pain on his lower back with tingling of left leg.  Patient received Fentanyl from EMS

## 2019-02-06 NOTE — ED Provider Notes (Signed)
[facility] Provider Note   CSN: [id number] Arrival date & time: 02/06/19  1850    History   Chief Complaint No chief complaint on file.   HPI Michael Blankenship is a 33 y.o. male.     33 yo M with a chief complaint of left-sided low back pain.  Feels like this happened before when he had a slipped disc.  He [place] and then came back here and said he had an MRI done showed it.  He has pain that starts in the left side of his back and radiates down to his foot.  Denies numbness or tingling denies weakness denies loss of bowel or bladder denies loss of peritoneal sensation.  Denies fever denies instrumentation of his back denies history of cancer.  The history is provided by the patient.  Illness  Severity:  Mild Onset quality:  Sudden Duration:  2 days Timing:  Constant Progression:  Worsening Chronicity:  New Associated symptoms: no abdominal pain, no chest pain, no congestion, no diarrhea, no fever, no headaches, no myalgias, no rash, no shortness of breath and no vomiting     Past Medical History:  Diagnosis Date  . Diabetes mellitus without complication (HCC)   . 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 Hyperlipidemia   . Hypertension     There are no active problems to display for this patient.   Past Surgical History:  Procedure Laterality Date  . ABCESS DRAINAGE          Home Medications    Prior to Admission medications   Medication Sig Start Date End Date Taking? Authorizing Provider  naproxen sodium (ALEVE) 220 MG tablet Take 440 mg by mouth daily as needed (pain).   Yes [provider]  amoxicillin-clavulanate (AUGMENTIN) 875-125 MG tablet Take 1 tablet by mouth every 12 (twelve) hours. Patient not taking: Reported on 02/06/2019 10/10/18   Liberty HandyGibbons, Claudia J, PA-C  benzocaine (AMERICAINE) 20 % rectal ointment Place rectally every 3 (three) hours as needed for pain. Patient not taking:  Reported on 02/06/2019 10/10/18   Liberty HandyGibbons, Claudia J, PA-C  hydrocortisone-pramoxine [facility](PROCTOFOAM-HC) rectal foam Place 1 applicator rectally 2 (two) times daily. Patient not taking: Reported on 02/06/2019 12/24/17   Aviva KluverMurray, Alyssa B, PA-C  ibuprofen (ADVIL,MOTRIN) 600 MG tablet Take 1 tablet (600 mg total) by mouth every 6 (six) hours as needed. Patient not taking: Reported on 02/06/2019 09/14/17   Lorre NickAllen, Anthony, MD  methocarbamol (ROBAXIN) 500 MG tablet Take 1 tablet (500 mg total) by mouth 2 (two) times daily. Patient not taking: Reported on 02/06/2019 09/14/17   Lorre NickAllen, Anthony, MD  podofilox (CONDYLOX) 0.5 % external solution Apply topically 2 (two) times daily. Patient not taking: Reported on 02/06/2019 10/10/18   Liberty HandyGibbons, Claudia J, PA-C  predniSONE (DELTASONE) 10 MG tablet Take 4 tablets once a day for 4 days. Take 3 tablets once a day for the next 4 days. Take 2 tablets once a day for the next 4 days. Take 1 tablet once a day for the next 4 days. Patient not taking: Reported on 02/06/2019 11/12/18   Sabas SousBero, Michael M, MD  traMADol (ULTRAM) 50 MG tablet Take 1 tablet (50 mg total) by mouth every 6 (six) hours as needed. Patient not taking: Reported on 02/06/2019 10/10/18   Liberty HandyGibbons, Claudia J, PA-C    Family History No family history on file.  Social History Social History   Tobacco Use  .  Smoking status: Current Every Day Smoker    Packs/day: 1.00    Types: Cigarettes  . Smokeless tobacco: Never Used  Substance Use Topics  . Alcohol use: Yes  . Drug use: No     Allergies   Patient has no known allergies.   Review of Systems Review of Systems  Constitutional: Negative for chills and fever.  HENT: Negative for congestion and facial swelling.   Eyes: Negative for discharge and visual disturbance.  Respiratory: Negative for shortness of breath.   Cardiovascular: Negative for chest pain and palpitations.  Gastrointestinal: Negative for abdominal pain, diarrhea and vomiting.    Musculoskeletal: Positive for back pain. Negative for arthralgias and myalgias.  Skin: Negative for color change and rash.  Neurological: Negative for tremors, syncope and headaches.  Psychiatric/Behavioral: Negative for confusion and dysphoric mood.     Physical Exam Updated Vital Signs BP 123/68   Pulse 62   Temp 98.6 F (37 C) (Oral)   Resp 18   Ht 6\' 1"  (1.854 m)   Wt 131.5 kg   SpO2 98%   BMI 38.26 kg/m   Physical Exam Vitals signs and nursing note reviewed.  Constitutional:      Appearance: He is well-developed.  HENT:     Head: Normocephalic and atraumatic.  Eyes:     Pupils: Pupils are equal, round, and reactive to light.  Neck:     Musculoskeletal: Normal range of motion and neck supple.     Vascular: No JVD.  Cardiovascular:     Rate and Rhythm: Normal rate and regular rhythm.     Heart sounds: No murmur. No friction rub. No gallop.   Pulmonary:     Effort: No respiratory distress.     Breath sounds: No wheezing.  Abdominal:     General: There is no distension.     Tenderness: There is no guarding or rebound.  Musculoskeletal: Normal range of motion.        General: Tenderness present.     Comments: No midline spinal tenderness.  Mild tenderness to the left SI joint area.  Pulse motor and sensation is intact distally.  Positive straight leg raise test bilaterally.  No clonus reflexes are 2+ and equal bilaterally.  Skin:    Coloration: Skin is not pale.     Findings: No rash.  Neurological:     Mental Status: He is alert and oriented to person, place, and time.  Psychiatric:        Behavior: Behavior normal.      ED Treatments / Results  Labs (all labs ordered are listed, but only abnormal results are displayed) Labs Reviewed - No data to display  EKG None  Radiology No results found.  Procedures Procedures (including critical care time)  Medications Ordered in ED Medications  acetaminophen (TYLENOL) tablet 1,000 mg (has no  administration in time range)  ketorolac (TORADOL) 15 MG/ML injection 15 mg (has no administration in time range)  oxyCODONE (Oxy IR/ROXICODONE) immediate release tablet 5 mg (has no administration in time range)  diazepam (VALIUM) tablet 5 mg (has no administration in time range)     Initial Impression / Assessment and Plan / ED Course  I have reviewed the triage vital signs and the nursing notes.  Pertinent labs & imaging results that were available during my care of the patient were reviewed by me and considered in my medical decision making (see chart for details).        33 yo M with  a chief complaint of left-sided low back pain.  He has a history of the same and had an MRI done previously.  He is able to ambulate here with pain.  We will treat his pain will have him follow-up with his PCP.  No cauda equina signs or symptoms.  7:39 PM:  I have discussed the diagnosis/risks/treatment options with the patient and family and believe the pt to be eligible for discharge home to follow-up with PCP. We also discussed returning to the ED immediately if new or worsening sx occur. We discussed the sx which are most concerning (e.g., sudden worsening pain, fever, inability to tolerate by mouth) that necessitate immediate return. Medications administered to the patient during their visit and any new prescriptions provided to the patient are listed below.  Medications given during this visit Medications  acetaminophen (TYLENOL) tablet 1,000 mg (has no administration in time range)  ketorolac (TORADOL) 15 MG/ML injection 15 mg (has no administration in time range)  oxyCODONE (Oxy IR/ROXICODONE) immediate release tablet 5 mg (has no administration in time range)  diazepam (VALIUM) tablet 5 mg (has no administration in time range)     The patient appears reasonably screen and/or stabilized for discharge and I doubt any other medical condition or other [facility] requiring further screening, evaluation, or  treatment in the ED at this time prior to discharge.    Final Clinical Impressions(s) / ED Diagnoses   Final diagnoses:  Sciatica of left side    ED Discharge Orders    None       Melene Plan, DO 02/06/19 1939

## 2019-02-06 NOTE — ED Notes (Signed)
Patient ambulated to the bathroom with minimal assistance but c/o 10/10 back pain and shoots down his left leg.

## 2019-02-06 NOTE — Discharge Instructions (Signed)
Take 4 over the counter ibuprofen tablets 3 times a day or 2 over-the-counter naproxen tablets twice a day for pain. Also take tylenol 1000mg (2 extra strength) four times a day.   No lifting greater than 10 pounds bending at the waist or twisting at the waist at least for the next week.  Follow-up with your family doctor.  They may suggest physical therapy for you.

## 2020-12-20 ENCOUNTER — Other Ambulatory Visit: Payer: Self-pay

## 2020-12-20 DIAGNOSIS — Z20822 Contact with and (suspected) exposure to covid-19: Secondary | ICD-10-CM

## 2020-12-23 LAB — SARS-COV-2, NAA 2 DAY TAT

## 2020-12-23 LAB — NOVEL CORONAVIRUS, NAA: SARS-CoV-2, NAA: DETECTED — AB

## 2020-12-24 ENCOUNTER — Telehealth: Payer: Self-pay | Admitting: Nurse Practitioner

## 2020-12-24 NOTE — Telephone Encounter (Signed)
Called to discuss with patient about COVID-19 symptoms and the use of one of the available treatments for those with mild to moderate Covid symptoms and at a high risk of hospitalization.  Pt appears to qualify for outpatient treatment due to co-morbid conditions and/or a member of an at-risk group in accordance with the FDA Emergency Use Authorization.    Symptom onset: unknown Vaccinated: unknown Booster? unknown Immunocompromised? unknown Qualifiers: BMI, smoker  Unable to reach pt - phone number listed was wrong number. Mychart message sent.  Trinidad Curet

## 2023-01-05 ENCOUNTER — Encounter (HOSPITAL_COMMUNITY): Payer: Self-pay

## 2023-01-05 ENCOUNTER — Emergency Department (HOSPITAL_COMMUNITY)
Admission: EM | Admit: 2023-01-05 | Discharge: 2023-01-05 | Disposition: A | Discharge status: Home / Self Care | Payer: PRIVATE HEALTH INSURANCE | Attending: Emergency Medicine | Admitting: Emergency Medicine

## 2023-01-05 DIAGNOSIS — Z5321 Procedure and treatment not carried out due to patient leaving prior to being seen by health care provider: Secondary | ICD-10-CM | POA: Insufficient documentation

## 2023-01-05 DIAGNOSIS — H9202 Otalgia, left ear: Secondary | ICD-10-CM | POA: Diagnosis not present

## 2023-01-05 DIAGNOSIS — R1310 Dysphagia, unspecified: Secondary | ICD-10-CM | POA: Insufficient documentation

## 2023-01-05 DIAGNOSIS — Z1152 Encounter for screening for COVID-19: Secondary | ICD-10-CM | POA: Insufficient documentation

## 2023-01-05 DIAGNOSIS — J029 Acute pharyngitis, unspecified: Secondary | ICD-10-CM | POA: Insufficient documentation

## 2023-01-05 LAB — RESP PANEL BY RT-PCR (RSV, FLU A&B, COVID)  RVPGX2
Influenza A by PCR: NEGATIVE
Influenza B by PCR: NEGATIVE
Resp Syncytial Virus by PCR: NEGATIVE
SARS Coronavirus 2 by RT PCR: NEGATIVE

## 2023-01-05 LAB — MONONUCLEOSIS SCREEN: Mono Screen: NEGATIVE

## 2023-01-05 LAB — GROUP A STREP BY PCR: Group A Strep by PCR: DETECTED — AB

## 2023-01-05 NOTE — ED Triage Notes (Signed)
Pt presents with c/o sore throat since Wednesday. Pt reports pain with swallowing. Pt also c/o left ear pain.

## 2023-01-05 NOTE — ED Provider Triage Note (Cosign Needed)
Emergency Medicine Provider Triage Evaluation Note  Michael Blankenship , a 37 y.o. male  was evaluated in triage.  Pt complains of concerns for sore throat onset 2 days.  Has associated painful swallowing, left ear pain.  Has tried over-the-counter medication for symptoms.  Denies trouble swallowing or trouble breathing.  Review of Systems  Positive:  Negative:   Physical Exam  BP (!) 157/114 (BP Location: Left Arm)   Pulse 92   Temp 98.8 F (37.1 C) (Oral)   Resp 20   SpO2 100%  Gen:   Awake, no distress   Resp:  Normal effort  MSK:   Moves extremities without difficulty  Other:  Exudate noted to bilateral tonsils with posterior pharyngeal erythema noted.  Uvula midline.  Able to speak in clear complete sentences.  Tolerating oral secretions.  Medical Decision Making  Medically screening exam initiated at 11:26 AM.  Appropriate orders placed.  Haywood Filler was informed that the remainder of the evaluation will be completed by another provider, this initial triage assessment does not replace that evaluation, and the importance of remaining in the ED until their evaluation is complete.  Workup initiated   Desi Rowe A, PA-C 01/05/23 1134

## 2023-01-06 ENCOUNTER — Ambulatory Visit (HOSPITAL_COMMUNITY)
Admission: EM | Admit: 2023-01-06 | Discharge: 2023-01-06 | Disposition: A | Discharge status: Home / Self Care | Payer: PRIVATE HEALTH INSURANCE | Attending: Emergency Medicine | Admitting: Emergency Medicine

## 2023-01-06 ENCOUNTER — Encounter (HOSPITAL_COMMUNITY): Payer: Self-pay | Admitting: Emergency Medicine

## 2023-01-06 DIAGNOSIS — J020 Streptococcal pharyngitis: Secondary | ICD-10-CM | POA: Diagnosis not present

## 2023-01-06 LAB — POCT RAPID STREP A, ED / UC: Streptococcus, Group A Screen (Direct): NEGATIVE

## 2023-01-06 MED ORDER — PENICILLIN G BENZATHINE 1200000 UNIT/2ML IM SUSY
1.2000 10*6.[IU] | PREFILLED_SYRINGE | Freq: Once | INTRAMUSCULAR | Status: AC
  Administered 2023-01-06: 1.2 10*6.[IU] via INTRAMUSCULAR

## 2023-01-06 MED ORDER — DEXAMETHASONE SODIUM PHOSPHATE 10 MG/ML IJ SOLN
10.0000 mg | Freq: Once | INTRAMUSCULAR | Status: AC
  Administered 2023-01-06: 10 mg via INTRAMUSCULAR

## 2023-01-06 MED ORDER — PENICILLIN G BENZATHINE 1200000 UNIT/2ML IM SUSY
PREFILLED_SYRINGE | INTRAMUSCULAR | Status: AC
  Filled 2023-01-06: qty 2

## 2023-01-06 MED ORDER — DEXAMETHASONE SODIUM PHOSPHATE 10 MG/ML IJ SOLN
INTRAMUSCULAR | Status: AC
  Filled 2023-01-06: qty 1

## 2023-01-06 NOTE — ED Triage Notes (Signed)
Pt states he is unable to swallow and has severe pain in the throat x4days. Pt will need a generic if prescribed medication and discount card because pt has no money .

## 2023-01-06 NOTE — Discharge Instructions (Signed)
We gave you a steroid injection and penicillin injection in office today, this should treat your symptoms.  If you develop any worsening/concerning symptoms please go to the nearest emergency department for further evaluation.

## 2023-01-06 NOTE — ED Provider Notes (Signed)
[place]    CSN: [id number] Arrival date & time: 01/06/23  1047      History   Chief Complaint Chief Complaint  Patient presents with   Sore Throat    HPI Michael Blankenship is a 37 y.o. male.  Patient presents complaining of sore throat that started 4 days ago.  Patient reports that his symptoms have progressively worsened.  Patient reports that he has had severe painful swallowing, he rates pain 10 out of 10.  He reports cervical lymphadenopathy. He denies any known exposure to strep.  He denies any recent oral sex encounter.  He denies any fever of chills. He reports that he has not taken any medications for symptoms.   Sore Throat Pertinent negatives include no chest pain, no abdominal pain and no shortness of breath.    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   Past Surgical History:  Procedure Laterality Date   ABCESS DRAINAGE         Home Medications    Prior to Admission medications   Medication Sig Start Date End Date Taking? Authorizing Provider  naproxen sodium (ALEVE) 220 MG tablet Take 440 mg by mouth daily as needed (pain).    [provider]    Family History History reviewed. No pertinent family history.  Social History Social History   Tobacco Use   Smoking status: Every Day    Packs/day: 1.00    Types: Cigarettes   Smokeless tobacco: Never  Substance Use Topics   Alcohol use: Yes   Drug use: No     Allergies   Patient has no known allergies.   Review of Systems Review of Systems  Constitutional:  Positive for appetite change. Negative for activity change, chills, fatigue and fever.  HENT:  Positive for sore throat and trouble swallowing. Negative for congestion, ear discharge, ear pain, postnasal drip, rhinorrhea, sinus pressure, sinus pain and voice change.   Eyes: Negative.   Respiratory:  Negative for  cough, chest tightness, shortness of breath and wheezing.   Cardiovascular:  Negative for chest pain and palpitations.  Gastrointestinal: Negative.  Negative for abdominal pain, diarrhea, nausea and vomiting.  Musculoskeletal:  Negative for myalgias.     Physical Exam Triage Vital Signs ED Triage Vitals  Enc Vitals Group     BP 01/06/23 1244 (!) 134/90     Pulse Rate 01/06/23 1244 75     Resp 01/06/23 1244 12     Temp 01/06/23 1244 98 F (36.7 C)     Temp Source 01/06/23 1244 Oral     SpO2 01/06/23 1244 97 %     Weight --      Height --      Head Circumference --      Peak Flow --      Pain Score 01/06/23 1241 10     Pain Loc --      Pain Edu? --      Excl. in [place]? --    No data found.  Updated Vital Signs BP (!) 134/90 (BP Location: Left Arm)   Pulse 75   Temp 98 F (36.7 C) (Oral)   Resp 12   SpO2 97%     Physical Exam Vitals and nursing note reviewed.  Constitutional:      Appearance: He is well-developed.  HENT:  Mouth/Throat:     Mouth: Mucous membranes are moist.     Pharynx: Uvula midline. Pharyngeal swelling, oropharyngeal exudate, posterior oropharyngeal erythema and uvula swelling present.     Tonsils: Tonsillar exudate present. No tonsillar abscesses. 3+ on the right. 3+ on the left.  Lymphadenopathy:     Head:     Right side of head: Tonsillar adenopathy present.     Left side of head: Tonsillar adenopathy present.     Cervical: Cervical adenopathy present.     Right cervical: Superficial cervical adenopathy present. No deep or posterior cervical adenopathy.    Left cervical: Superficial cervical adenopathy present. No deep or posterior cervical adenopathy.  Neurological:     Mental Status: He is alert.      UC Treatments / Results  Labs (all labs ordered are listed, but only abnormal results are displayed) Labs Reviewed  POCT RAPID STREP A, ED / UC    EKG   Radiology No results found.  Procedures Procedures (including critical  care time)  Medications Ordered in UC Medications  dexamethasone (DECADRON) injection 10 mg (10 mg Intramuscular Given 01/06/23 1326)  penicillin g benzathine (BICILLIN LA) 1200000 UNIT/2ML injection 1.2 Million Units (1.2 Million Units Intramuscular Given 01/06/23 1326)    Initial Impression / Assessment and Plan / UC Course  I have reviewed the triage vital signs and the nursing notes.  Pertinent labs & imaging results that were available during my care of the patient were reviewed by me and considered in my medical decision making (see chart for details).     Patient was evaluated for pharyngitis due to Streptococcus.  POC strep was negative. This Probation officer reviewed result from [facility] that occurred yesterday on (01/06/2023) which showed Group A strep was positive. IM Bicillin and Decadron given which provided the patient some relief of symptoms in office. Patient was given strict ER precautions in the case that his symptoms are not improving.  Patient was made aware of timeline for symptom resolution and when follow-up will be necessary.  Patient verbalized understanding of instructions.  Charting was provided using a a verbal dictation system, charting was proofread for errors, errors may occur which could change the meaning of the information charted.   Final Clinical Impressions(s) / UC Diagnoses   Final diagnoses:  Pharyngitis due to Streptococcus species     Discharge Instructions      We gave you a steroid injection and penicillin injection in office today, this should treat your symptoms.  If you develop any worsening/concerning symptoms please go to the nearest emergency department for further evaluation.      ED Prescriptions   None    PDMP not reviewed this encounter.   Flossie Dibble, NP 01/06/23 1407

## 2023-03-03 ENCOUNTER — Ambulatory Visit (HOSPITAL_COMMUNITY)
Admission: EM | Admit: 2023-03-03 | Discharge: 2023-03-03 | Disposition: A | Discharge status: Home / Self Care | Payer: PRIVATE HEALTH INSURANCE | Attending: Physician Assistant | Admitting: Physician Assistant

## 2023-03-03 ENCOUNTER — Encounter (HOSPITAL_COMMUNITY): Payer: Self-pay

## 2023-03-03 DIAGNOSIS — R030 Elevated blood-pressure reading, without diagnosis of hypertension: Secondary | ICD-10-CM | POA: Diagnosis not present

## 2023-03-03 DIAGNOSIS — K047 Periapical abscess without sinus: Secondary | ICD-10-CM | POA: Diagnosis not present

## 2023-03-03 MED ORDER — IBUPROFEN 600 MG PO TABS: 600.0000 mg | ORAL_TABLET | Freq: Four times a day (QID) | ORAL | 0 refills | Status: AC | PRN

## 2023-03-03 MED ORDER — CHLORHEXIDINE GLUCONATE 0.12 % MT SOLN: 15.0000 mL | Freq: Two times a day (BID) | OROMUCOSAL | 0 refills | Status: AC

## 2023-03-03 MED ORDER — AMOXICILLIN-POT CLAVULANATE 875-125 MG PO TABS: 1.0000 | ORAL_TABLET | Freq: Two times a day (BID) | ORAL | 0 refills | Status: DC

## 2023-03-03 NOTE — ED Triage Notes (Signed)
Pt present to the office for sinus pressure and nasal congestion. Pt reports an abscess in his mouth.

## 2023-03-03 NOTE — ED Provider Notes (Signed)
[place]    CSN: [id number] Arrival date & time: 03/03/23  1422      History   Chief Complaint Chief Complaint  Patient presents with   Facial Pain   Abscess    HPI Rudis Mercure is a 37 y.o. male presenting for concerns of left facial pain and dental abscess.  Symptoms going on for about a week.  Has a history of dental issues.  Denies any fever or chills.  No discharge from his mouth.  No bleeding.  No other symptoms.   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   Past Surgical History:  Procedure Laterality Date   ABCESS DRAINAGE         Home Medications    Prior to Admission medications   Medication Sig Start Date End Date Taking? Authorizing Provider  amoxicillin-clavulanate (AUGMENTIN) 875-125 MG tablet Take 1 tablet by mouth every 12 (twelve) hours. Take with food. 03/03/23  Yes Keimya Briddell M, PA-C  chlorhexidine (PERIDEX) 0.12 % solution Use as directed 15 mLs in the mouth or throat 2 (two) times daily. Swish and spit. 03/03/23  Yes Skyler Carel M, PA-C  ibuprofen (ADVIL) 600 MG tablet Take 1 tablet (600 mg total) by mouth every 6 (six) hours as needed for moderate pain. 03/03/23  Yes Shiquan Mathieu M, PA-C  naproxen sodium (ALEVE) 220 MG tablet Take 440 mg by mouth daily as needed (pain).    [provider]    Family History History reviewed. No pertinent family history.  Social History Social History   Tobacco Use   Smoking status: Every Day    Packs/day: 1    Types: Cigarettes   Smokeless tobacco: Never  Substance Use Topics   Alcohol use: Yes   Drug use: No     Allergies   Patient has no known allergies.   Review of Systems Review of Systems   Physical Exam Triage Vital Signs ED Triage Vitals  Enc Vitals Group     BP 03/03/23 1552 (!) 153/106     Pulse Rate 03/03/23 1552 86     Resp 03/03/23 1552  18     Temp 03/03/23 1552 97.6 F (36.4 C)     Temp Source 03/03/23 1552 Oral     SpO2 03/03/23 1552 100 %     Weight --      Height --      Head Circumference --      Peak Flow --      Pain Score 03/03/23 1556 10     Pain Loc --      Pain Edu? --      Excl. in [place]? --    No data found.  Updated Vital Signs BP 128/88 (BP Location: Left Arm)   Pulse 86   Temp 97.6 F (36.4 C) (Oral)   Resp 18   SpO2 100%    Physical Exam Vitals and nursing note reviewed.  Constitutional:      Appearance: Normal appearance.  HENT:     Mouth/Throat:     Dentition: Abnormal dentition. Dental caries present.     Comments: Left upper gumline tender firm nodule  Cardiovascular:     Rate and Rhythm: Normal rate and regular rhythm.     Pulses: Normal pulses.     Heart sounds: No murmur heard. Pulmonary:  Effort: Pulmonary effort is normal.     Breath sounds: Normal breath sounds.  Neurological:     General: No focal deficit present.     Mental Status: He is alert.  Psychiatric:        Mood and Affect: Mood normal.        Behavior: Behavior normal.      UC Treatments / Results  Labs (all labs ordered are listed, but only abnormal results are displayed) Labs Reviewed - No data to display  EKG   Radiology No results found.  Procedures Procedures (including critical care time)  Medications Ordered in UC Medications - No data to display  Initial Impression / Assessment and Plan / UC Course  I have reviewed the triage vital signs and the nursing notes.  Pertinent labs & imaging results that were available during my care of the patient were reviewed by me and considered in my medical decision making (see chart for details).     Dental abscess.  No red flags on exam.  Plan to take Augmentin and ibuprofen as directed.  Use Peridex solution.  Resources provided with dental follow-up.  ED precautions advised.  Initially his blood pressure was elevated, which had him  concerned, blood pressure greatly improved to 128/88 prior to discharge.  Encouraged him to follow-up with her primary care provider and resources provided for this today as well. Final Clinical Impressions(s) / UC Diagnoses   Final diagnoses:  Dental abscess  Elevated blood pressure reading     Discharge Instructions      Good to meet you today.  Please take the Augmentin and ibuprofen as directed.  You may take Tylenol in addition to the ibuprofen for pain management.  You can use Peridex solution to help with dental pain.  Please see resources provided to set yourself up with a dentist.  Your blood pressure was elevated on initial reading, and improved, although I would like to see your bottom number closer to 70s or low 80s.  I do think it is best to establish with primary care for regular follow-up.     ED Prescriptions     Medication Sig Dispense Auth. Provider   amoxicillin-clavulanate (AUGMENTIN) 875-125 MG tablet Take 1 tablet by mouth every 12 (twelve) hours. Take with food. 14 tablet Kameka Whan M, PA-C   chlorhexidine (PERIDEX) 0.12 % solution Use as directed 15 mLs in the mouth or throat 2 (two) times daily. Swish and spit. 120 mL Lavette Yankovich M, PA-C   ibuprofen (ADVIL) 600 MG tablet Take 1 tablet (600 mg total) by mouth every 6 (six) hours as needed for moderate pain. 30 tablet Lindie Roberson M, PA-C      I have reviewed the PDMP during this encounter.   AllwardtRanda Evens, PA-C 03/03/23 1706

## 2023-03-03 NOTE — Discharge Instructions (Signed)
Good to meet you today.  Please take the Augmentin and ibuprofen as directed.  You may take Tylenol in addition to the ibuprofen for pain management.  You can use Peridex solution to help with dental pain.  Please see resources provided to set yourself up with a dentist.  Your blood pressure was elevated on initial reading, and improved, although I would like to see your bottom number closer to 70s or low 80s.  I do think it is best to establish with primary care for regular follow-up.

## 2023-03-04 ENCOUNTER — Telehealth (HOSPITAL_COMMUNITY): Payer: Self-pay | Admitting: Emergency Medicine

## 2023-03-04 DIAGNOSIS — K047 Periapical abscess without sinus: Secondary | ICD-10-CM

## 2023-03-04 MED ORDER — AMOXICILLIN 500 MG PO CAPS: 500.0000 mg | ORAL_CAPSULE | Freq: Two times a day (BID) | ORAL | 0 refills | Status: AC

## 2023-03-04 NOTE — Telephone Encounter (Signed)
Called reporting Augmentin was too expensive ($20) Switched to plain amoxicillin by this provider ($6)
